# Patient Record
Sex: Male | Born: 1960
Health system: Southern US, Community
[De-identification: ages and names within clinical notes are randomized; demographics above are authoritative.]

## PROBLEM LIST (undated history)

## (undated) DIAGNOSIS — N200 Calculus of kidney: Secondary | ICD-10-CM

---

## 2001-06-06 ENCOUNTER — Emergency Department (HOSPITAL_COMMUNITY): Admission: EM | Admit: 2001-06-06 | Discharge: 2001-06-06 | Payer: Self-pay | Admitting: Emergency Medicine

## 2001-06-06 ENCOUNTER — Encounter: Payer: Self-pay | Admitting: Emergency Medicine

## 2001-06-18 ENCOUNTER — Encounter: Admission: RE | Admit: 2001-06-18 | Discharge: 2001-06-18 | Payer: Self-pay | Admitting: Urology

## 2001-06-18 ENCOUNTER — Encounter: Payer: Self-pay | Admitting: Urology

## 2001-07-02 ENCOUNTER — Emergency Department (HOSPITAL_COMMUNITY): Admission: EM | Admit: 2001-07-02 | Discharge: 2001-07-02 | Payer: Self-pay | Admitting: Emergency Medicine

## 2001-07-02 ENCOUNTER — Encounter: Payer: Self-pay | Admitting: Emergency Medicine

## 2001-07-06 ENCOUNTER — Ambulatory Visit (HOSPITAL_COMMUNITY): Admission: RE | Admit: 2001-07-06 | Discharge: 2001-07-06 | Payer: Self-pay | Admitting: Urology

## 2007-09-06 ENCOUNTER — Encounter: Admission: RE | Admit: 2007-09-06 | Discharge: 2007-09-06 | Payer: Self-pay | Admitting: Family Medicine

## 2010-11-05 NOTE — Op Note (Signed)
[facility]  Patient:    Louis Kennedy, Louis Kennedy Visit Number: [id number] MRN: [id number]          Service Type: DSU Location: DAY Attending Physician:  Londell Moh Dictated by:   Jamison Neighbor, M.D. Proc. Date: 07/06/01 Admit Date:  07/06/2001 Discharge Date: 07/06/2001                             Operative Report  SERVICE:  Urology.  PREOPERATIVE DIAGNOSIS:  Left UVJ stone.  POSTOPERATIVE DIAGNOSIS:  Left UVJ stone.  PROCEDURE:  Cystoscopy, left retrograde, left ureteroscopy and left double J catheter insertion.  SURGEON:  Jamison Neighbor, M.D.  ANESTHESIA:  General.  COMPLICATIONS:  None.  DRAINS:  A 6 French x 26 cm double J catheter.  BRIEF HISTORY:  This patient presented to the emergency room because of what he thought was a kidney stone. CT scan showed a 3 mm stone in the proximal ureter. The patient had a follow-up KUB which showed a 3 mm stone had dropped down to the SI joint. He tried to pass the stone and subsequently it appeared to get stuck down at the bottom of the ureter. The patient was told that he had a very good chance of passing the stone but his symptoms had worsened to the point where he decided he wanted to have the stone removed. The patient understands the risks and benefits of the procedure and gave full and informed consent.  DESCRIPTION OF PROCEDURE:  After successful induction of general anesthesia, the patient was placed in the dorsal lithotomy position and prepped with Betadine and draped in the usual sterile fashion. He was quarry just before anesthesia to determine if he had passed the stone. The patient stated that he had not yet passed it and there was still residual pain on the left hand side. Cystoscopy was performed, the bladder was carefully inspected and no tumors or stones could be seen. The right ureteral orifice was normal in configuration and location. The left ureteral orifice was edematous  and swollen consistent with the recent passage of the stone in the ureter. Attempts at cannulating the ureter were difficult because it did appear that there might be an obstructing lesion in the intramural ureter. The guidewire would not pass due to angulation. The ureteroscope was inserted and was passed into the normal lumen and passed all the way up to the kidney. No stones could be seen. The ureteroscope was withdrawn and the area was carefully inspected. It had dilated up nicely because of the ureter and while there was some swelling there, no calcification in the ureteral lumen could be seen. It is unclear whether the patient may have passed the stone or if perhaps the calcifications previously may be intramural and the calcification have eroded their way through. In either case, nothing is in the course of the ureter that requires removal. A guidewire was passed all the way up to the kidney and a ureteral catheter was passed, contrast was placed within the kidney and this confirmed good placement and demonstrated there were no filling defects in the kidney and as previously noted the upper ureter had been cleared ureteroscopically. The guidewire was left in place, ureteral catheter was removed. Using the cystoscope for guidance, a double J catheter was passed up to the kidney where it coiled normally. It also coiled normally in the bladder. The bladder was drained. A lidocaine jelly was instilled  into the urethra. A B&O suppository was inserted, the patient was given Toradol and Zofran. The patient tolerated the procedure well and was taken to the recovery room in good condition. Dictated by:   Jamison Neighbor, M.D. Attending Physician:  Londell Moh DD:  07/06/01 TD:  07/08/01 Job: [id number]

## 2015-10-16 ENCOUNTER — Other Ambulatory Visit: Payer: Self-pay | Admitting: Family Medicine

## 2015-10-16 DIAGNOSIS — R4702 Dysphasia: Secondary | ICD-10-CM

## 2015-10-23 ENCOUNTER — Ambulatory Visit
Admission: RE | Admit: 2015-10-23 | Discharge: 2015-10-23 | Disposition: A | Discharge status: Home / Self Care | Payer: Managed Care, Other (non HMO) | Source: Ambulatory Visit | Attending: Family Medicine | Admitting: Family Medicine

## 2015-10-23 DIAGNOSIS — R4702 Dysphasia: Secondary | ICD-10-CM

## 2016-06-08 ENCOUNTER — Ambulatory Visit
Admission: RE | Admit: 2016-06-08 | Discharge: 2016-06-08 | Disposition: A | Discharge status: Home / Self Care | Payer: Managed Care, Other (non HMO) | Source: Ambulatory Visit | Attending: Family Medicine | Admitting: Family Medicine

## 2016-06-08 ENCOUNTER — Other Ambulatory Visit: Payer: Managed Care, Other (non HMO)

## 2016-06-08 ENCOUNTER — Other Ambulatory Visit: Payer: Self-pay | Admitting: Family Medicine

## 2016-06-08 DIAGNOSIS — M25422 Effusion, left elbow: Secondary | ICD-10-CM

## 2016-06-08 DIAGNOSIS — R141 Gas pain: Secondary | ICD-10-CM

## 2018-07-16 DIAGNOSIS — N401 Enlarged prostate with lower urinary tract symptoms: Secondary | ICD-10-CM | POA: Diagnosis not present

## 2018-07-16 DIAGNOSIS — R3912 Poor urinary stream: Secondary | ICD-10-CM | POA: Diagnosis not present

## 2018-07-23 DIAGNOSIS — R972 Elevated prostate specific antigen [PSA]: Secondary | ICD-10-CM | POA: Diagnosis not present

## 2018-07-23 DIAGNOSIS — N529 Male erectile dysfunction, unspecified: Secondary | ICD-10-CM | POA: Diagnosis not present

## 2019-02-18 DIAGNOSIS — I10 Essential (primary) hypertension: Secondary | ICD-10-CM | POA: Diagnosis not present

## 2019-02-18 DIAGNOSIS — Z0000 Encounter for general adult medical examination without abnormal findings: Secondary | ICD-10-CM | POA: Diagnosis not present

## 2019-02-18 DIAGNOSIS — E7800 Pure hypercholesterolemia, unspecified: Secondary | ICD-10-CM | POA: Diagnosis not present

## 2019-02-18 DIAGNOSIS — Z23 Encounter for immunization: Secondary | ICD-10-CM | POA: Diagnosis not present

## 2019-04-27 DIAGNOSIS — Z20828 Contact with and (suspected) exposure to other viral communicable diseases: Secondary | ICD-10-CM | POA: Diagnosis not present

## 2019-06-22 ENCOUNTER — Other Ambulatory Visit: Payer: Self-pay

## 2019-06-22 ENCOUNTER — Emergency Department (HOSPITAL_COMMUNITY): Payer: BC Managed Care – PPO

## 2019-06-22 ENCOUNTER — Emergency Department (HOSPITAL_COMMUNITY)
Admission: EM | Admit: 2019-06-22 | Discharge: 2019-06-22 | Disposition: A | Discharge status: Home / Self Care | Payer: BC Managed Care – PPO | Attending: Emergency Medicine | Admitting: Emergency Medicine

## 2019-06-22 ENCOUNTER — Encounter (HOSPITAL_COMMUNITY): Payer: Self-pay | Admitting: Emergency Medicine

## 2019-06-22 DIAGNOSIS — U071 COVID-19: Secondary | ICD-10-CM | POA: Diagnosis not present

## 2019-06-22 DIAGNOSIS — R918 Other nonspecific abnormal finding of lung field: Secondary | ICD-10-CM | POA: Diagnosis not present

## 2019-06-22 DIAGNOSIS — R000 Tachycardia, unspecified: Secondary | ICD-10-CM | POA: Diagnosis not present

## 2019-06-22 DIAGNOSIS — R509 Fever, unspecified: Secondary | ICD-10-CM | POA: Insufficient documentation

## 2019-06-22 DIAGNOSIS — Z79899 Other long term (current) drug therapy: Secondary | ICD-10-CM | POA: Insufficient documentation

## 2019-06-22 DIAGNOSIS — R05 Cough: Secondary | ICD-10-CM | POA: Diagnosis not present

## 2019-06-22 DIAGNOSIS — R0602 Shortness of breath: Secondary | ICD-10-CM

## 2019-06-22 HISTORY — DX: Calculus of kidney: N20.0

## 2019-06-22 LAB — CBC WITH DIFFERENTIAL/PLATELET
Abs Immature Granulocytes: 0.03 10*3/uL (ref 0.00–0.07)
Basophils Absolute: 0 10*3/uL (ref 0.0–0.1)
Basophils Relative: 0 %
Eosinophils Absolute: 0 10*3/uL (ref 0.0–0.5)
Eosinophils Relative: 0 %
HCT: 46.3 % (ref 39.0–52.0)
Hemoglobin: 14.8 g/dL (ref 13.0–17.0)
Immature Granulocytes: 0 %
Lymphocytes Relative: 6 %
Lymphs Abs: 0.7 10*3/uL (ref 0.7–4.0)
MCH: 27.2 pg (ref 26.0–34.0)
MCHC: 32 g/dL (ref 30.0–36.0)
MCV: 85.1 fL (ref 80.0–100.0)
Monocytes Absolute: 0.6 10*3/uL (ref 0.1–1.0)
Monocytes Relative: 6 %
Neutro Abs: 9.6 10*3/uL — ABNORMAL HIGH (ref 1.7–7.7)
Neutrophils Relative %: 88 %
Platelets: 146 10*3/uL — ABNORMAL LOW (ref 150–400)
RBC: 5.44 MIL/uL (ref 4.22–5.81)
RDW: 12.4 % (ref 11.5–15.5)
WBC: 10.9 10*3/uL — ABNORMAL HIGH (ref 4.0–10.5)
nRBC: 0 % (ref 0.0–0.2)

## 2019-06-22 LAB — COMPREHENSIVE METABOLIC PANEL
ALT: 72 U/L — ABNORMAL HIGH (ref 0–44)
AST: 44 U/L — ABNORMAL HIGH (ref 15–41)
Albumin: 3.6 g/dL (ref 3.5–5.0)
Alkaline Phosphatase: 63 U/L (ref 38–126)
Anion gap: 10 (ref 5–15)
BUN: 11 mg/dL (ref 6–20)
CO2: 27 mmol/L (ref 22–32)
Calcium: 9.7 mg/dL (ref 8.9–10.3)
Chloride: 98 mmol/L (ref 98–111)
Creatinine, Ser: 0.97 mg/dL (ref 0.61–1.24)
GFR calc Af Amer: >60 mL/min (ref >60)
GFR calc non Af Amer: >60 mL/min (ref >60)
Glucose, Bld: 109 mg/dL — ABNORMAL HIGH (ref 70–99)
Potassium: 3.5 mmol/L (ref 3.5–5.1)
Sodium: 135 mmol/L (ref 135–145)
Total Bilirubin: 0.9 mg/dL (ref 0.3–1.2)
Total Protein: 6.8 g/dL (ref 6.5–8.1)

## 2019-06-22 LAB — FERRITIN: Ferritin: 324 ng/mL (ref 24–336)

## 2019-06-22 LAB — TRIGLYCERIDES: Triglycerides: 46 mg/dL (ref <150)

## 2019-06-22 LAB — D-DIMER, QUANTITATIVE: D-Dimer, Quant: 0.7 ug/mL-FEU — ABNORMAL HIGH (ref 0.00–0.50)

## 2019-06-22 LAB — C-REACTIVE PROTEIN: CRP: 14.3 mg/dL — ABNORMAL HIGH (ref <1.0)

## 2019-06-22 LAB — PROCALCITONIN: Procalcitonin: <0.1 ng/mL

## 2019-06-22 LAB — LACTATE DEHYDROGENASE: LDH: 203 U/L — ABNORMAL HIGH (ref 98–192)

## 2019-06-22 LAB — LACTIC ACID, PLASMA
Lactic Acid, Venous: 1 mmol/L (ref 0.5–1.9)
Lactic Acid, Venous: 1.6 mmol/L (ref 0.5–1.9)

## 2019-06-22 LAB — FIBRINOGEN: Fibrinogen: 758 mg/dL — ABNORMAL HIGH (ref 210–475)

## 2019-06-22 MED ORDER — SODIUM CHLORIDE (PF) 0.9 % IJ SOLN
INTRAMUSCULAR | Status: AC
  Filled 2019-06-22: qty 50

## 2019-06-22 MED ORDER — ACETAMINOPHEN 500 MG PO TABS
1000.0000 mg | ORAL_TABLET | Freq: Once | ORAL | Status: AC
  Administered 2019-06-22: 1000 mg via ORAL
  Filled 2019-06-22: qty 2

## 2019-06-22 MED ORDER — IOHEXOL 350 MG/ML SOLN
100.0000 mL | Freq: Once | INTRAVENOUS | Status: AC | PRN
  Administered 2019-06-22: 100 mL via INTRAVENOUS

## 2019-06-22 NOTE — ED Provider Notes (Signed)
Blood pressure (!) 159/89, pulse (!) 129, temperature 99.9 F (37.7 C), temperature source Oral, resp. rate (!) 30, height 5\' 7"  (1.702 m), weight 67.1 kg, SpO2 96 %.  Assuming care from Dr. .  In short, Louis Kennedy is a 59 y.o. male with a chief complaint of COVID + .  Refer to the original H&P for additional details.  The current plan of care is to f/u on labs and reassess.  08:50 AM  Patient reevaluated.  He is breathing comfortably on room air but does develop tachypnea with movement.  He has persistent tachycardia to the 120s.  Plan for CTA to evaluate for PE and will ambulate on pulse ox. Labs reviewed and consistent with COVID 19 infection.   10:14 AM  Reevaluated the patient.  He remains comfortable appearing.  He ambulated around the room with no reported dyspnea.  Lowest oxygen saturation of 93%.  The patient CT scan of the chest did not show PE and did show multifocal pneumonia consistent with COVID-19 diagnosis.  No pericardial effusion.  Patient does remain tachycardic here but is relatively asymptomatic and not requiring supplemental oxygen.  I had him put on his home pulse ox which is consistently reading lower than ours by approximately 6 percentage points.  We will continue to follow symptoms at home.  He feels comfortable with plan of discharge with strict return precautions discussed.    61, MD 06/22/19 1015

## 2019-06-22 NOTE — ED Notes (Signed)
SPO2 while resting: 96%. SPO2 while ambulating: 93%. He is in no distress and is undergoing chest CT as I write this.

## 2019-06-22 NOTE — ED Triage Notes (Signed)
Pt reports having confirmed COVID and pneumonia. Pt currently taking Azithromycin and Motrin that was recently prescribed. Pt states it feels like heart is racing.

## 2019-06-22 NOTE — ED Provider Notes (Signed)
[facility] [facility] Provider Note: Georgena Spurling, MD, FACEP  CSN: [id number] MRN: [id number] ARRIVAL: 06/22/19 at [place]: Tat Momoli  06/22/19 5:50 AM Louis Kennedy is a 59 y.o. male with COVID-19.  His symptoms began 8 days ago.  He has been having cough, shortness of breath, body aches, fever, rapid heart rate when fever is up, and general malaise.  He was placed on azithromycin earlier in the week for presumed pneumonia.  He has been taking ibuprofen for fever.  His last dose was about 10:30 PM.  He is now feeling more short of breath, worse with exertion.  He denies frank pain at the present time.   Past Medical History:  Diagnosis Date  . Kidney stone     History reviewed. No pertinent surgical history.  History reviewed. No pertinent family history.  Social History   Tobacco Use  . Smoking status: Never Smoker  . Smokeless tobacco: Never Used  Substance Use Topics  . Alcohol use: Never  . Drug use: Never    Prior to Admission medications   Medication Sig Start Date End Date Taking? Authorizing Provider  amLODipine (NORVASC) 5 MG tablet Take 5 mg by mouth daily.   Yes [provider]  atorvastatin (LIPITOR) 10 MG tablet Take 10 mg by mouth daily.   Yes [provider]    Allergies Penicillins   REVIEW OF SYSTEMS  Negative except as noted here or in the History of Present Illness.   PHYSICAL EXAMINATION  Initial Vital Signs Blood pressure (!) 147/78, pulse (!) 107, temperature 99.9 F (37.7 C), temperature source Oral, resp. rate 19, height 5\' 7"  (1.702 m), weight 67.1 kg, SpO2 96 %.  Examination General: Well-developed, well-nourished male in no acute distress; appearance consistent with age of record HENT: normocephalic; atraumatic Eyes: Normal appearance Neck: supple Heart: regular rate and rhythm; tachycardia Lungs: Mildly decreased air movement bilaterally Abdomen:  soft; nondistended; nontender; bowel sounds present Extremities: No deformity; full range of motion Neurologic: Awake, alert and oriented; motor function intact in all extremities and symmetric; no facial droop; shivering Skin: Warm and dry Psychiatric: Normal mood and affect   RESULTS  Summary of this visit's results, reviewed and interpreted by myself:   EKG Interpretation  Date/Time:  Saturday June 22 2019 06:51:32 EST Ventricular Rate:  129 PR Interval:    QRS Duration: 79 QT Interval:  282 QTC Calculation: 413 R Axis:   -50 Text Interpretation: Sinus tachycardia Left anterior fascicular block Abnormal R-wave progression, early transition No previous ECGs available Confirmed by Shanon Rosser [phone]) on 06/22/2019 7:03:47 AM      Laboratory Studies: Results for orders placed or performed during the hospital encounter of 06/22/19 (from the past 24 hour(s))  Lactic acid, plasma     Status: None   Collection Time: 06/22/19  6:51 AM  Result Value Ref Range   Lactic Acid, Venous 1.0 0.5 - 1.9 mmol/L  Lactic acid, plasma     Status: None   Collection Time: 06/22/19  6:51 AM  Result Value Ref Range   Lactic Acid, Venous 1.6 0.5 - 1.9 mmol/L  CBC WITH DIFFERENTIAL     Status: Abnormal   Collection Time: 06/22/19  6:51 AM  Result Value Ref Range   WBC 10.9 (H) 4.0 - 10.5 K/uL   RBC 5.44 4.22 - 5.81 MIL/uL   Hemoglobin 14.8 13.0 - 17.0 g/dL   HCT 46.3  39.0 - 52.0 %   MCV 85.1 80.0 - 100.0 fL   MCH 27.2 26.0 - 34.0 pg   MCHC 32.0 30.0 - 36.0 g/dL   RDW 38.2 50.5 - 39.7 %   Platelets 146 (L) 150 - 400 K/uL   nRBC 0.0 0.0 - 0.2 %   Neutrophils Relative % 88 %   Neutro Abs 9.6 (H) 1.7 - 7.7 K/uL   Lymphocytes Relative 6 %   Lymphs Abs 0.7 0.7 - 4.0 K/uL   Monocytes Relative 6 %   Monocytes Absolute 0.6 0.1 - 1.0 K/uL   Eosinophils Relative 0 %   Eosinophils Absolute 0.0 0.0 - 0.5 K/uL   Basophils Relative 0 %   Basophils Absolute 0.0 0.0 - 0.1 K/uL   Immature Granulocytes  0 %   Abs Immature Granulocytes 0.03 0.00 - 0.07 K/uL  Comprehensive metabolic panel     Status: Abnormal   Collection Time: 06/22/19  6:51 AM  Result Value Ref Range   Sodium 135 135 - 145 mmol/L   Potassium 3.5 3.5 - 5.1 mmol/L   Chloride 98 98 - 111 mmol/L   CO2 27 22 - 32 mmol/L   Glucose, Bld 109 (H) 70 - 99 mg/dL   BUN 11 6 - 20 mg/dL   Creatinine, Ser 6.73 0.61 - 1.24 mg/dL   Calcium 9.7 8.9 - 41.9 mg/dL   Total Protein 6.8 6.5 - 8.1 g/dL   Albumin 3.6 3.5 - 5.0 g/dL   AST 44 (H) 15 - 41 U/L   ALT 72 (H) 0 - 44 U/L   Alkaline Phosphatase 63 38 - 126 U/L   Total Bilirubin 0.9 0.3 - 1.2 mg/dL   GFR calc non Af Amer >60 >60 mL/min   GFR calc Af Amer >60 >60 mL/min   Anion gap 10 5 - 15  D-dimer, quantitative     Status: Abnormal   Collection Time: 06/22/19  6:51 AM  Result Value Ref Range   D-Dimer, Quant 0.70 (H) 0.00 - 0.50 ug/mL-FEU  Procalcitonin     Status: None   Collection Time: 06/22/19  6:51 AM  Result Value Ref Range   Procalcitonin <0.10 ng/mL  Lactate dehydrogenase     Status: Abnormal   Collection Time: 06/22/19  6:51 AM  Result Value Ref Range   LDH 203 (H) 98 - 192 U/L  Ferritin     Status: None   Collection Time: 06/22/19  6:51 AM  Result Value Ref Range   Ferritin 324 24 - 336 ng/mL  Triglycerides     Status: None   Collection Time: 06/22/19  6:51 AM  Result Value Ref Range   Triglycerides 46 <150 mg/dL  Fibrinogen     Status: Abnormal   Collection Time: 06/22/19  6:51 AM  Result Value Ref Range   Fibrinogen 758 (H) 210 - 475 mg/dL  C-reactive protein     Status: Abnormal   Collection Time: 06/22/19  6:51 AM  Result Value Ref Range   CRP 14.3 (H) <1.0 mg/dL   Imaging Studies: CT Angio Chest PE W and/or Wo Contrast  Result Date: 06/22/2019 CLINICAL DATA:  Shortness of breath, elevated D-dimer level. EXAM: CT ANGIOGRAPHY CHEST WITH CONTRAST TECHNIQUE: Multidetector CT imaging of the chest was performed using the standard protocol during bolus  administration of intravenous contrast. Multiplanar CT image reconstructions and MIPs were obtained to evaluate the vascular anatomy. CONTRAST:  OMNIPAQUE IOHEXOL 350 MG/ML SOLN COMPARISON:  None. FINDINGS: Cardiovascular: Satisfactory opacification  of the pulmonary arteries to the segmental level. No evidence of pulmonary embolism. Normal heart size. No pericardial effusion. Mediastinum/Nodes: No enlarged mediastinal, hilar, or axillary lymph nodes. Thyroid gland, trachea, and esophagus demonstrate no significant findings. Lungs/Pleura: No pneumothorax or pleural effusion is noted. Multiple ground-glass opacities are noted throughout both lungs consistent with multifocal pneumonia, potentially of viral etiology. Upper Abdomen: No acute abnormality. Musculoskeletal: No chest wall abnormality. No acute or significant osseous findings. Review of the MIP images confirms the above findings. IMPRESSION: 1. No definite evidence of pulmonary embolus. 2. Multiple ground-glass opacities are noted throughout both lungs consistent with multifocal pneumonia, potentially of viral etiology. Electronically Signed   By: Lupita Raider M.D.   On: 06/22/2019 09:55   DG Chest Port 1 View  Result Date: 06/22/2019 CLINICAL DATA:  COVID-19 positive. EXAM: PORTABLE CHEST 1 VIEW COMPARISON:  None. FINDINGS: Confluent airspace disease at the right lung base, with additional vague patchy opacities in the right mid lung and left lower lung zone. The heart is normal in size. No pulmonary edema, pleural effusion, or pneumothorax. No acute osseous abnormalities are seen. IMPRESSION: Vague patchy opacities in both lungs, pattern consistent with COVID-19 pneumonia. More confluent right lower lobe opacity may represent COVID or superimposed bacterial infection. Electronically Signed   By: Narda Rutherford M.D.   On: 06/22/2019 06:20    ED COURSE and MDM  Nursing notes, initial and subsequent vitals signs, including pulse oximetry,  reviewed and interpreted by myself.  Vitals:   06/22/19 0924 06/22/19 0925 06/22/19 0930 06/22/19 1015  BP: 136/68  (!) 151/84   Pulse: (!) 121  (!) 119 (!) 118  Resp: (!) 24  (!) 29 (!) 29  Temp:  99.9 F (37.7 C)    TempSrc:  Oral    SpO2: 95%  95% 96%  Weight:      Height:       Medications  acetaminophen (TYLENOL) tablet 1,000 mg (1,000 mg Oral Given 06/22/19 0640)  iohexol (OMNIPAQUE) 350 MG/ML injection 100 mL (100 mLs Intravenous Contrast Given 06/22/19 0945)   7:00 AM Signed out to Dr. Jacqulyn Bath.   PROCEDURES  Procedures   ED DIAGNOSES     ICD-10-CM   1. COVID-19  U07.1   2. SOB (shortness of breath) on exertion  R06.02   3. Tachycardia  R00.0        Lorrin Nawrot, Jonny Ruiz, MD 06/22/19 2241

## 2019-06-22 NOTE — Discharge Instructions (Signed)
You were seen in the emergency department today with shortness of breath consistent with your known COVID-19 diagnosis.  You should continue to monitor your symptoms at home very carefully.  If your pulse ox at home is reading consistently in the mid to low 80s you should return.  If your pulse ox remains in the normal range but you develop chest pain, worsening shortness of breath, severe fatigue, or other sudden worsening symptoms he should also return to the emergency department.

## 2019-06-24 ENCOUNTER — Other Ambulatory Visit: Payer: Self-pay

## 2019-06-24 ENCOUNTER — Emergency Department (HOSPITAL_COMMUNITY): Payer: BC Managed Care – PPO

## 2019-06-24 ENCOUNTER — Encounter (HOSPITAL_COMMUNITY): Payer: Self-pay | Admitting: Emergency Medicine

## 2019-06-24 ENCOUNTER — Inpatient Hospital Stay (HOSPITAL_COMMUNITY)
Admission: EM | Admit: 2019-06-24 | Discharge: 2019-06-27 | DRG: 871 | Disposition: A | Discharge status: Home / Self Care | Payer: BC Managed Care – PPO | Attending: Internal Medicine | Admitting: Internal Medicine

## 2019-06-24 DIAGNOSIS — R0602 Shortness of breath: Secondary | ICD-10-CM | POA: Diagnosis not present

## 2019-06-24 DIAGNOSIS — E785 Hyperlipidemia, unspecified: Secondary | ICD-10-CM | POA: Diagnosis not present

## 2019-06-24 DIAGNOSIS — A4189 Other specified sepsis: Principal | ICD-10-CM | POA: Diagnosis present

## 2019-06-24 DIAGNOSIS — J1282 Pneumonia due to coronavirus disease 2019: Secondary | ICD-10-CM | POA: Diagnosis not present

## 2019-06-24 DIAGNOSIS — J9601 Acute respiratory failure with hypoxia: Secondary | ICD-10-CM | POA: Diagnosis present

## 2019-06-24 DIAGNOSIS — J069 Acute upper respiratory infection, unspecified: Secondary | ICD-10-CM | POA: Diagnosis not present

## 2019-06-24 DIAGNOSIS — Z79899 Other long term (current) drug therapy: Secondary | ICD-10-CM | POA: Diagnosis not present

## 2019-06-24 DIAGNOSIS — Z880 Allergy status to penicillin: Secondary | ICD-10-CM

## 2019-06-24 DIAGNOSIS — I10 Essential (primary) hypertension: Secondary | ICD-10-CM | POA: Diagnosis present

## 2019-06-24 DIAGNOSIS — U071 COVID-19: Secondary | ICD-10-CM | POA: Diagnosis not present

## 2019-06-24 LAB — TRIGLYCERIDES: Triglycerides: 46 mg/dL

## 2019-06-24 LAB — CBC WITH DIFFERENTIAL/PLATELET
Abs Immature Granulocytes: 0.18 10*3/uL — ABNORMAL HIGH (ref 0.00–0.07)
Basophils Absolute: 0 10*3/uL (ref 0.0–0.1)
Basophils Relative: 0 %
Eosinophils Absolute: 0 10*3/uL (ref 0.0–0.5)
Eosinophils Relative: 0 %
HCT: 43.4 % (ref 39.0–52.0)
Hemoglobin: 13.9 g/dL (ref 13.0–17.0)
Immature Granulocytes: 1 %
Lymphocytes Relative: 4 %
Lymphs Abs: 0.7 10*3/uL (ref 0.7–4.0)
MCH: 26.9 pg (ref 26.0–34.0)
MCHC: 32 g/dL (ref 30.0–36.0)
MCV: 84.1 fL (ref 80.0–100.0)
Monocytes Absolute: 1 10*3/uL (ref 0.1–1.0)
Monocytes Relative: 6 %
Neutro Abs: 15 10*3/uL — ABNORMAL HIGH (ref 1.7–7.7)
Neutrophils Relative %: 89 %
Platelets: 204 10*3/uL (ref 150–400)
RBC: 5.16 MIL/uL (ref 4.22–5.81)
RDW: 12.6 % (ref 11.5–15.5)
WBC: 16.9 10*3/uL — ABNORMAL HIGH (ref 4.0–10.5)
nRBC: 0 % (ref 0.0–0.2)

## 2019-06-24 LAB — HEPATIC FUNCTION PANEL
ALT: 191 U/L — ABNORMAL HIGH (ref 0–44)
AST: 116 U/L — ABNORMAL HIGH (ref 15–41)
Albumin: 3 g/dL — ABNORMAL LOW (ref 3.5–5.0)
Alkaline Phosphatase: 92 U/L (ref 38–126)
Bilirubin, Direct: 0.4 mg/dL — ABNORMAL HIGH (ref 0.0–0.2)
Indirect Bilirubin: 0.5 mg/dL (ref 0.3–0.9)
Total Bilirubin: 0.9 mg/dL (ref 0.3–1.2)
Total Protein: 6.7 g/dL (ref 6.5–8.1)

## 2019-06-24 LAB — BASIC METABOLIC PANEL
Anion gap: 9 (ref 5–15)
BUN: 9 mg/dL (ref 6–20)
CO2: 27 mmol/L (ref 22–32)
Calcium: 10 mg/dL (ref 8.9–10.3)
Chloride: 99 mmol/L (ref 98–111)
Creatinine, Ser: 1.02 mg/dL (ref 0.61–1.24)
GFR calc Af Amer: >60 mL/min (ref >60)
GFR calc non Af Amer: >60 mL/min (ref >60)
Glucose, Bld: 151 mg/dL — ABNORMAL HIGH (ref 70–99)
Potassium: 3.9 mmol/L (ref 3.5–5.1)
Sodium: 135 mmol/L (ref 135–145)

## 2019-06-24 LAB — D-DIMER, QUANTITATIVE: D-Dimer, Quant: 0.83 ug/mL-FEU — ABNORMAL HIGH (ref 0.00–0.50)

## 2019-06-24 LAB — CREATININE, SERUM
Creatinine, Ser: 0.91 mg/dL (ref 0.61–1.24)
GFR calc Af Amer: >60 mL/min (ref >60)
GFR calc non Af Amer: >60 mL/min (ref >60)

## 2019-06-24 LAB — HIV ANTIBODY (ROUTINE TESTING W REFLEX): HIV Screen 4th Generation wRfx: NONREACTIVE

## 2019-06-24 LAB — ABO/RH: ABO/RH(D): O POS

## 2019-06-24 LAB — C-REACTIVE PROTEIN: CRP: 22.9 mg/dL — ABNORMAL HIGH

## 2019-06-24 LAB — LACTATE DEHYDROGENASE: LDH: 306 U/L — ABNORMAL HIGH (ref 98–192)

## 2019-06-24 LAB — LACTIC ACID, PLASMA: Lactic Acid, Venous: 1.2 mmol/L (ref 0.5–1.9)

## 2019-06-24 LAB — FIBRINOGEN: Fibrinogen: >800 mg/dL — ABNORMAL HIGH (ref 210–475)

## 2019-06-24 LAB — FERRITIN: Ferritin: 908 ng/mL — ABNORMAL HIGH (ref 24–336)

## 2019-06-24 LAB — PROCALCITONIN: Procalcitonin: 0.25 ng/mL

## 2019-06-24 LAB — SARS CORONAVIRUS 2 (TAT 6-24 HRS): SARS Coronavirus 2: POSITIVE — AB

## 2019-06-24 LAB — BRAIN NATRIURETIC PEPTIDE: B Natriuretic Peptide: 42.8 pg/mL (ref 0.0–100.0)

## 2019-06-24 MED ORDER — SODIUM CHLORIDE 0.9% FLUSH: 3.0000 mL | INTRAVENOUS | Status: DC | PRN

## 2019-06-24 MED ORDER — TOCILIZUMAB 400 MG/20ML IV SOLN: 8.0000 mg/kg | Freq: Once | INTRAVENOUS | Status: DC

## 2019-06-24 MED ORDER — SENNOSIDES-DOCUSATE SODIUM 8.6-50 MG PO TABS: 1.0000 | ORAL_TABLET | Freq: Every evening | ORAL | Status: DC | PRN

## 2019-06-24 MED ORDER — ALBUTEROL SULFATE HFA 108 (90 BASE) MCG/ACT IN AERS
2.0000 | INHALATION_SPRAY | Freq: Four times a day (QID) | RESPIRATORY_TRACT | Status: DC
  Administered 2019-06-24 – 2019-06-27 (×14): 2 via RESPIRATORY_TRACT
  Filled 2019-06-24: qty 6.7

## 2019-06-24 MED ORDER — SODIUM CHLORIDE 0.9 % IV SOLN: Freq: Once | INTRAVENOUS | Status: AC

## 2019-06-24 MED ORDER — SODIUM CHLORIDE 0.9 % IV SOLN: 250.0000 mL | INTRAVENOUS | Status: DC | PRN

## 2019-06-24 MED ORDER — DEXAMETHASONE SODIUM PHOSPHATE 10 MG/ML IJ SOLN
6.0000 mg | INTRAMUSCULAR | Status: DC
  Administered 2019-06-24 – 2019-06-25 (×2): 6 mg via INTRAVENOUS
  Filled 2019-06-24 (×2): qty 1

## 2019-06-24 MED ORDER — ATORVASTATIN CALCIUM 10 MG PO TABS
10.0000 mg | ORAL_TABLET | Freq: Every day | ORAL | Status: DC
  Administered 2019-06-24 – 2019-06-25 (×2): 10 mg via ORAL
  Filled 2019-06-24 (×2): qty 1

## 2019-06-24 MED ORDER — ENOXAPARIN SODIUM 40 MG/0.4ML ~~LOC~~ SOLN
40.0000 mg | SUBCUTANEOUS | Status: DC
  Administered 2019-06-24 – 2019-06-27 (×4): 40 mg via SUBCUTANEOUS
  Filled 2019-06-24 (×4): qty 0.4

## 2019-06-24 MED ORDER — ASCORBIC ACID 500 MG PO TABS
500.0000 mg | ORAL_TABLET | Freq: Every day | ORAL | Status: DC
  Administered 2019-06-24 – 2019-06-27 (×4): 500 mg via ORAL
  Filled 2019-06-24 (×4): qty 1

## 2019-06-24 MED ORDER — ACETAMINOPHEN 325 MG PO TABS
650.0000 mg | ORAL_TABLET | Freq: Four times a day (QID) | ORAL | Status: DC | PRN
  Administered 2019-06-24: 650 mg via ORAL
  Filled 2019-06-24: qty 2

## 2019-06-24 MED ORDER — SODIUM CHLORIDE 0.9% FLUSH
3.0000 mL | Freq: Two times a day (BID) | INTRAVENOUS | Status: DC
  Administered 2019-06-24 – 2019-06-25 (×4): 3 mL via INTRAVENOUS
  Administered 2019-06-26: 10 mL via INTRAVENOUS
  Administered 2019-06-26 – 2019-06-27 (×2): 3 mL via INTRAVENOUS

## 2019-06-24 MED ORDER — FUROSEMIDE 10 MG/ML IJ SOLN
20.0000 mg | Freq: Once | INTRAMUSCULAR | Status: AC
  Administered 2019-06-24: 20 mg via INTRAVENOUS
  Filled 2019-06-24: qty 4

## 2019-06-24 MED ORDER — SODIUM CHLORIDE 0.9 % IV SOLN: 100.0000 mg | Freq: Every day | INTRAVENOUS | Status: DC

## 2019-06-24 MED ORDER — DEXAMETHASONE SODIUM PHOSPHATE 10 MG/ML IJ SOLN
6.0000 mg | INTRAMUSCULAR | Status: DC
  Filled 2019-06-24: qty 1

## 2019-06-24 MED ORDER — GUAIFENESIN-DM 100-10 MG/5ML PO SYRP: 10.0000 mL | ORAL_SOLUTION | ORAL | Status: DC | PRN

## 2019-06-24 MED ORDER — SODIUM CHLORIDE 0.9 % IV SOLN
200.0000 mg | Freq: Once | INTRAVENOUS | Status: AC
  Administered 2019-06-24: 200 mg via INTRAVENOUS
  Filled 2019-06-24: qty 200

## 2019-06-24 MED ORDER — METOPROLOL TARTRATE 25 MG PO TABS
25.0000 mg | ORAL_TABLET | Freq: Two times a day (BID) | ORAL | Status: DC
  Administered 2019-06-24 – 2019-06-27 (×7): 25 mg via ORAL
  Filled 2019-06-24 (×7): qty 1

## 2019-06-24 MED ORDER — SODIUM CHLORIDE 0.9 % IV SOLN
100.0000 mg | Freq: Every day | INTRAVENOUS | Status: DC
  Administered 2019-06-25 – 2019-06-27 (×3): 100 mg via INTRAVENOUS
  Filled 2019-06-24 (×3): qty 20

## 2019-06-24 MED ORDER — LEVOFLOXACIN 750 MG PO TABS
750.0000 mg | ORAL_TABLET | Freq: Every day | ORAL | Status: DC
  Administered 2019-06-24 – 2019-06-25 (×2): 750 mg via ORAL
  Filled 2019-06-24 (×2): qty 1

## 2019-06-24 MED ORDER — HYDROCODONE-ACETAMINOPHEN 5-325 MG PO TABS: 1.0000 | ORAL_TABLET | ORAL | Status: DC | PRN

## 2019-06-24 MED ORDER — ZINC SULFATE 220 (50 ZN) MG PO CAPS
220.0000 mg | ORAL_CAPSULE | Freq: Every day | ORAL | Status: DC
  Administered 2019-06-24 – 2019-06-27 (×4): 220 mg via ORAL
  Filled 2019-06-24 (×4): qty 1

## 2019-06-24 MED ORDER — SODIUM CHLORIDE 0.9 % IV SOLN
200.0000 mg | Freq: Once | INTRAVENOUS | Status: DC
  Filled 2019-06-24: qty 40

## 2019-06-24 MED ORDER — ACETAMINOPHEN 500 MG PO TABS
1000.0000 mg | ORAL_TABLET | Freq: Once | ORAL | Status: AC
  Administered 2019-06-24: 1000 mg via ORAL
  Filled 2019-06-24: qty 2

## 2019-06-24 MED ORDER — TOCILIZUMAB 400 MG/20ML IV SOLN
8.0000 mg/kg | Freq: Once | INTRAVENOUS | Status: AC
  Administered 2019-06-24: 536 mg via INTRAVENOUS
  Filled 2019-06-24: qty 20

## 2019-06-24 NOTE — H&P (Addendum)
History and Physical        Hospital Admission Note Date: 06/24/2019  Patient name: Louis Kennedy Medical record number: [id number] Date of birth: 09-15-1960 Age: 59 y.o. Gender: male  PCP: Patient, No Pcp Per    Patient coming from: home   I have reviewed all records in the [facility].    Chief Complaint:  Cough, diarrhea, fevers, worsening shortness of breath  HPI: Patient is a 59 year old male with history of hypertension, hyperlipidemia presented to ED with COVID-19.  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  He had taken full precautions however 2 weeks ago he had to go to the ER for muscle spasm and pain in his legs.  He thinks that is how he got the Covid.  10 days ago he started having productive coughing, generalized weakness, nausea and diarrhea.  Diarrhea is currently improving.  6 days ago he had Covid test done which was positive in [place].  He returned back to [facility] on December 31,2020.  Patient reported that his symptoms have been worsening, he initially could walk to his bathroom however now he he feels short of breath with minimal exertion.  He was started on Zithromax outpatient.  Also reported fevers and rigors at home.  Patient has a Covid positive test on 06/18/2019 in care everywhere   ED work-up/course:  Temp 100.7 in ED, respiratory rate 35, BP 138/86, heart rate 119 -130. BMET unremarkable, LDH 306, triglycerides 46, ferritin 908 COVID-19 test in epic pending CT angiogram of the chest showed no PE, multiple groundglass opacities noted throughout both lungs consistent with multifocal pneumonia potentially a viral etiology  Review of Systems: Positives marked in 'bold' Constitutional: + fever, chills, diaphoresis, poor appetite and fatigue.  HEENT: Denies photophobia, eye pain,  redness, hearing loss, ear pain, congestion, sore throat, rhinorrhea, sneezing, mouth sores, trouble swallowing, neck pain, neck stiffness and tinnitus.   Respiratory: Please see HPI Cardiovascular: Also feels palpitation and heart racing Gastrointestinal: Denies blood in stool and abdominal distention. + Nausea and diarrhea Genitourinary: Denies dysuria, urgency, frequency, hematuria, flank pain and difficulty urinating.  Musculoskeletal: +myalgias, no back pain, joint swelling, arthralgias and gait problem.  Skin: Denies pallor, rash and wound.  Neurological: Denies numbness and headaches. + Generalized weakness  hematological: Denies adenopathy. Easy bruising, personal or family bleeding history  Psychiatric/Behavioral: Denies suicidal ideation, mood changes, confusion, nervousness, sleep disturbance and agitation  Past Medical History: Past Medical History:  Diagnosis Date  . Kidney stone     History reviewed. No pertinent surgical history.  Medications: Prior to Admission medications   Medication Sig Start Date End Date Taking? Authorizing Provider  amLODipine (NORVASC) 5 MG tablet Take 5 mg by mouth daily.    [provider]  atorvastatin (LIPITOR) 10 MG tablet Take 10 mg by mouth daily.    [provider]    Allergies:   Allergies  Allergen Reactions  . Penicillins Other (See Comments)    Dizziness  Did it involve swelling of the face/tongue/throat, SOB, or low BP? No Did it involve sudden or severe rash/hives, skin peeling,  or any reaction on the inside of your mouth or nose? No  Did you need to seek medical attention at a hospital or doctor's office? No When did it last happen?Several years ago If all above answers are "NO", may proceed with cephalosporin use.     Social History:  reports that he has never smoked. He has never used smokeless tobacco. He reports that he does not drink alcohol or use drugs.  Family History: Patient reports  that his family members also have Covid but his symptoms are worse  Physical Exam: Blood pressure (!) 154/92, pulse (!) 130, temperature (!) 100.7 F (38.2 C), temperature source Oral, resp. rate (!) 26, SpO2 94 %. General: Alert, awake, oriented x3, uncomfortable, no acute respiratory distress Eyes: pink conjunctiva,anicteric sclera, pupils equal and reactive to light and accomodation, HEENT: normocephalic, atraumatic, oropharynx clear Neck: supple, no masses or lymphadenopathy, no goiter, no bruits, no JVD CVS: Regular rate and rhythm, without murmurs, rubs or gallops. No lower extremity edema Resp : Decreased breath sound at the bases GI : Soft, nontender, nondistended, positive bowel sounds, no masses. No hepatomegaly. No hernia.  Musculoskeletal: No clubbing or cyanosis, positive pedal pulses. No contracture. ROM intact  Neuro: Grossly intact, no focal neurological deficits, strength 5/5 upper and lower extremities bilaterally Psych: alert and oriented x 3, normal mood and affect Skin: no rashes or lesions, warm and dry   LABS on Admission: I have personally reviewed all the labs and imagings below    Basic Metabolic Panel: Recent Labs  Lab 06/22/19 0651 06/24/19 0542  NA 135 135  K 3.5 3.9  CL 98 99  CO2 27 27  GLUCOSE 109* 151*  BUN 11 9  CREATININE 0.97 1.02  CALCIUM 9.7 10.0   Liver Function Tests: Recent Labs  Lab 06/22/19 0651  AST 44*  ALT 72*  ALKPHOS 63  BILITOT 0.9  PROT 6.8  ALBUMIN 3.6   No results for input(s): LIPASE, AMYLASE in the last 168 hours. No results for input(s): AMMONIA in the last 168 hours. CBC: Recent Labs  Lab 06/22/19 0651 06/24/19 0542  WBC 10.9* 16.9*  NEUTROABS 9.6* 15.0*  HGB 14.8 13.9  HCT 46.3 43.4  MCV 85.1 84.1  PLT 146* 204   Cardiac Enzymes: No results for input(s): CKTOTAL, CKMB, CKMBINDEX, TROPONINI in the last 168 hours. BNP: Invalid input(s): POCBNP CBG: No results for input(s): GLUCAP in the last 168  hours.  Radiological Exams on Admission:  CT Angio Chest PE W and/or Wo Contrast  Result Date: 06/22/2019 CLINICAL DATA:  Shortness of breath, elevated D-dimer level. EXAM: CT ANGIOGRAPHY CHEST WITH CONTRAST TECHNIQUE: Multidetector CT imaging of the chest was performed using the standard protocol during bolus administration of intravenous contrast. Multiplanar CT image reconstructions and MIPs were obtained to evaluate the vascular anatomy. CONTRAST:  147m OMNIPAQUE IOHEXOL 350 MG/ML SOLN COMPARISON:  None. FINDINGS: Cardiovascular: Satisfactory opacification of the pulmonary arteries to the segmental level. No evidence of pulmonary embolism. Normal heart size. No pericardial effusion. Mediastinum/Nodes: No enlarged mediastinal, hilar, or axillary lymph nodes. Thyroid gland, trachea, and esophagus demonstrate no significant findings. Lungs/Pleura: No pneumothorax or pleural effusion is noted. Multiple ground-glass opacities are noted throughout both lungs consistent with multifocal pneumonia, potentially of viral etiology. Upper Abdomen: No acute abnormality. Musculoskeletal: No chest wall abnormality. No acute or significant osseous findings. Review of the MIP images confirms the above findings. IMPRESSION: 1. No definite evidence of pulmonary embolus. 2. Multiple ground-glass opacities are noted throughout  both lungs consistent with multifocal pneumonia, potentially of viral etiology. Electronically Signed   By: Marijo Conception M.D.   On: 06/22/2019 09:55   DG Chest Port 1 View  Result Date: 06/24/2019 CLINICAL DATA:  Hypoxia. Shortness of breath.  COVID-19 positive. EXAM: PORTABLE CHEST 1 VIEW COMPARISON:  Radiographs and CTA 06/22/2019 FINDINGS: Persistent low lung volumes. Worsening bilateral airspace disease primarily at the left lung base. The heart is normal in size. Normal mediastinal contours. No pneumothorax or large pleural effusion. IMPRESSION: Worsening bilateral airspace disease consistent  with COVID pneumonia. Electronically Signed   By: Keith Rake M.D.   On: 06/24/2019 05:57      EKG: Independently reviewed.  Rate 130, sinus rhythm, tachycardia   Assessment/Plan Principal Problem:  Acute hypoxic respiratory failure due to acute COVID-19 viral pneumonia during the ongoing 2020 COVID-19 pandemic- POA, sepsis - Patient presented with shortness of breath, dyspnea on exertion, fevers, rigors, diarrhea.  CT angiogram chest showed multifocal pneumonia, no PE.  Met sepsis criteria with fevers, tachycardia, tachypnea, multifocal pneumonia. - Currently hypoxic, requiring 3 L O2 via nasal cannula -Started Decadron 6 mg IV daily, Remdesivir per pharmacy protocol  -CRP elevated, 22.9, on 3 L O2, will give 1 dose of Actemra.  Procalcitonin mildly elevated 0.25, patient was on Zithromax prior to admission, will place on Levaquin, has penicillin allergy.  Discussed in detail with the patient, has no history of TB or immunosuppression, LFTs normal.  He agrees to proceed with Actemra - Continue Supportive care: vitamin C/zinc, albuterol, Tylenol. - Continue to wean oxygen, ambulatory O2 screening daily as tolerated  - Oxygen - SpO2: 96 % O2 Flow Rate (L/min): 3 L/min - Continue to follow labs as below  No results found for: SARSCOV2NAA .  Patient has a positive Covid test from 06/18/2019 in care everywhere  Recent Labs  Lab 06/22/19 0651 06/24/19 0542 06/24/19 0543  DDIMER 0.70* 0.83*  --   FERRITIN 324  --  908*  CRP 14.3*  --  22.9*  ALT 72*  --   --   PROCALCITON <0.10 0.25  --     Addendum:  O2 sats worsening, from 4 L to 10 L -Ordered 1 dose of IV Lasix 20 mg, prone, transfer to [facility] when bed available  Essential hypertension with tachycardia - will place on Lopressor 25 mg twice daily  Hyperlipidemia LFTs within normal limits, continue statin   DVT prophylaxis: Lovenox  CODE STATUS: Full CODE STATUS discussed with the patient  Consults called:  None  Family Communication: Admission, patients condition and plan of care including tests being ordered have been discussed with the patient who indicates understanding and agree with the plan and Code Status  Admission status: Inpatient, MedSurg  The medical decision making on this patient was of high complexity and the patient is at high risk for clinical deterioration, therefore this is a level 3 admission.  Severity of Illness:    The appropriate patient status for this patient is INPATIENT. Inpatient status is judged to be reasonable and necessary in order to provide the required intensity of service to ensure the patient's safety. The patient's presenting symptoms, physical exam findings, and initial radiographic and laboratory data in the context of their chronic comorbidities is felt to place them at high risk for further clinical deterioration. Furthermore, it is not anticipated that the patient will be medically stable for discharge from the hospital within 2 midnights of admission. The following factors support the patient status of  inpatient.   " The patient's presenting symptoms include shortness of breath, tachypnea, tachycardia " The worrisome physical exam findings include acute respiratory disease " The initial radiographic and laboratory data are worrisome because of elevated inflammatory markers, Covid " The chronic co-morbidities include none.   * I certify that at the point of admission it is my clinical judgment that the patient will require inpatient hospital care spanning beyond 2 midnights from the point of admission due to high intensity of service, high risk for further deterioration and high frequency of surveillance required.*    Time Spent on Admission: 70 minutes     Farrell Broerman M.D. [facility] 06/24/2019, 8:13 AM

## 2019-06-24 NOTE — ED Notes (Signed)
[facility] here for transport. 

## 2019-06-24 NOTE — ED Notes (Addendum)
[facility] called for transport to [facility]. Papers at nurse's station. Attempted to call and give report to [facility], no response

## 2019-06-24 NOTE — ED Provider Notes (Signed)
[facility]-EMERGENCY DEPT Provider Note   CSN: [id number] Arrival date & time: 06/24/19  5027     History Chief Complaint  Patient presents with  . Covid+  . Shortness of Breath    Louis Kennedy is a 59 y.o. male.   59 year old male presents to the emergency department for complaints of shortness of breath.  He has had progressive worsening symptoms.  Yesterday was able to ambulate to the bathroom and would only feel winded when he reached back to his bed.  Now reports that any movement makes him feel short of breath.  He has not had any loss of consciousness.  Reports low-grade fever which has been managed with ibuprofen and Tylenol.  Last took ibuprofen 8 hours ago.  Also completed a course of azithromycin yesterday for coverage of bacterial pneumonia.    Patient with history of positive COVID-19 on 06/18/19 visible on Care Everywhere.  Believes that he was exposed with symptom onset approximately 10 days ago.  Was evaluated 2 days ago in the emergency department for similar complaints.  Had laboratory evaluation as well as CTA during this assessment.  CTA was negative for pulmonary embolus, but did show multifocal pneumonia c/w Covid infection.  PCP - [facility]  The history is provided by the patient. No language interpreter was used.  Shortness of Breath      Past Medical History:  Diagnosis Date  . Kidney stone     There are no problems to display for this patient.   History reviewed. No pertinent surgical history.     No family history on file.  Social History   Tobacco Use  . Smoking status: Never Smoker  . Smokeless tobacco: Never Used  Substance Use Topics  . Alcohol use: Never  . Drug use: Never    Home Medications Prior to Admission medications   Medication Sig Start Date End Date Taking? Authorizing Provider  amLODipine (NORVASC) 5 MG tablet Take 5 mg by mouth daily.    [provider]  atorvastatin (LIPITOR)  10 MG tablet Take 10 mg by mouth daily.    [provider]    Allergies    Penicillins  Review of Systems   Review of Systems  Respiratory: Positive for shortness of breath.   Ten systems reviewed and are negative for acute change, except as noted in the HPI.    Physical Exam Updated Vital Signs BP (!) 148/89 (BP Location: Left Arm)   Pulse (!) 118   Temp 100 F (37.8 C) (Oral)   Resp (!) 31   SpO2 95%   Physical Exam Vitals and nursing note reviewed.  Constitutional:      General: He is not in acute distress.    Appearance: He is well-developed. He is not diaphoretic.     Comments: Nontoxic appearing and in NAD  HENT:     Head: Normocephalic and atraumatic.  Eyes:     General: No scleral icterus.    Conjunctiva/sclera: Conjunctivae normal.  Cardiovascular:     Rate and Rhythm: Regular rhythm. Tachycardia present.     Pulses: Normal pulses.  Pulmonary:     Effort: Pulmonary effort is normal. No respiratory distress.     Breath sounds: No stridor.     Comments: Mild tachypnea without dyspnea. No accessory muscle use. Sats 95% on 3L via [place]. Abdominal:     Palpations: Abdomen is soft.  Musculoskeletal:        General: Normal range of motion.  Cervical back: Normal range of motion.  Skin:    General: Skin is warm and dry.     Coloration: Skin is not pale.     Findings: No erythema or rash.  Neurological:     Mental Status: He is alert and oriented to person, place, and time.     Coordination: Coordination normal.  Psychiatric:        Behavior: Behavior normal.     ED Results / Procedures / Treatments   Labs (all labs ordered are listed, but only abnormal results are displayed) Labs Reviewed  CBC WITH DIFFERENTIAL/PLATELET - Abnormal; Notable for the following components:      Result Value   WBC 16.9 (*)    Neutro Abs 15.0 (*)    Abs Immature Granulocytes 0.18 (*)    All other components within normal limits  BASIC METABOLIC PANEL - Abnormal;  Notable for the following components:   Glucose, Bld 151 (*)    All other components within normal limits  D-DIMER, QUANTITATIVE (NOT AT [facility]) - Abnormal; Notable for the following components:   D-Dimer, Quant 0.83 (*)    All other components within normal limits  LACTATE DEHYDROGENASE - Abnormal; Notable for the following components:   LDH 306 (*)    All other components within normal limits  CULTURE, BLOOD (ROUTINE X 2)  CULTURE, BLOOD (ROUTINE X 2)  LACTIC ACID, PLASMA  LACTIC ACID, PLASMA  PROCALCITONIN  FERRITIN  TRIGLYCERIDES  FIBRINOGEN  C-REACTIVE PROTEIN    EKG EKG Interpretation  Date/Time:  Monday June 24 2019 05:33:33 EST Ventricular Rate:  120 PR Interval:    QRS Duration: 83 QT Interval:  307 QTC Calculation: 434 R Axis:   -31 Text Interpretation: Sinus tachycardia Ventricular premature complex Aberrant conduction of SV complex(es) LAE, consider biatrial enlargement Left axis deviation Abnormal R-wave progression, early transition Borderline T abnormalities, anterior leads No significant change since last tracing Confirmed by Rochele Raring [phone]) on 06/24/2019 5:56:59 AM   Radiology CT Angio Chest PE W and/or Wo Contrast  Result Date: 06/22/2019 CLINICAL DATA:  Shortness of breath, elevated D-dimer level. EXAM: CT ANGIOGRAPHY CHEST WITH CONTRAST TECHNIQUE: Multidetector CT imaging of the chest was performed using the standard protocol during bolus administration of intravenous contrast. Multiplanar CT image reconstructions and MIPs were obtained to evaluate the vascular anatomy. CONTRAST:  OMNIPAQUE IOHEXOL 350 MG/ML SOLN COMPARISON:  None. FINDINGS: Cardiovascular: Satisfactory opacification of the pulmonary arteries to the segmental level. No evidence of pulmonary embolism. Normal heart size. No pericardial effusion. Mediastinum/Nodes: No enlarged mediastinal, hilar, or axillary lymph nodes. Thyroid gland, trachea, and esophagus demonstrate no significant  findings. Lungs/Pleura: No pneumothorax or pleural effusion is noted. Multiple ground-glass opacities are noted throughout both lungs consistent with multifocal pneumonia, potentially of viral etiology. Upper Abdomen: No acute abnormality. Musculoskeletal: No chest wall abnormality. No acute or significant osseous findings. Review of the MIP images confirms the above findings. IMPRESSION: 1. No definite evidence of pulmonary embolus. 2. Multiple ground-glass opacities are noted throughout both lungs consistent with multifocal pneumonia, potentially of viral etiology. Electronically Signed   By: Lupita Raider M.D.   On: 06/22/2019 09:55   DG Chest Port 1 View  Result Date: 06/24/2019 CLINICAL DATA:  Hypoxia. Shortness of breath.  COVID-19 positive. EXAM: PORTABLE CHEST 1 VIEW COMPARISON:  Radiographs and CTA 06/22/2019 FINDINGS: Persistent low lung volumes. Worsening bilateral airspace disease primarily at the left lung base. The heart is normal in size. Normal mediastinal contours. No pneumothorax  or large pleural effusion. IMPRESSION: Worsening bilateral airspace disease consistent with COVID pneumonia. Electronically Signed   By: Keith Rake M.D.   On: 06/24/2019 05:57   DG Chest Port 1 View  Result Date: 06/22/2019 CLINICAL DATA:  COVID-19 positive. EXAM: PORTABLE CHEST 1 VIEW COMPARISON:  None. FINDINGS: Confluent airspace disease at the right lung base, with additional vague patchy opacities in the right mid lung and left lower lung zone. The heart is normal in size. No pulmonary edema, pleural effusion, or pneumothorax. No acute osseous abnormalities are seen. IMPRESSION: Vague patchy opacities in both lungs, pattern consistent with COVID-19 pneumonia. More confluent right lower lobe opacity may represent COVID or superimposed bacterial infection. Electronically Signed   By: Keith Rake M.D.   On: 06/22/2019 06:20    Procedures .Critical Care Performed by: Antonietta Breach, PA-C Authorized  by: Antonietta Breach, PA-C   Critical care provider statement:    Critical care time (minutes):  45   Critical care was necessary to treat or prevent imminent or life-threatening deterioration of the following conditions:  Respiratory failure   Critical care was time spent personally by me on the following activities:  Discussions with consultants, evaluation of patient's response to treatment, examination of patient, ordering and performing treatments and interventions, ordering and review of laboratory studies, ordering and review of radiographic studies, pulse oximetry, re-evaluation of patient's condition, obtaining history from patient or surrogate and review of old charts   (including critical care time)  Medications Ordered in ED Medications  acetaminophen (TYLENOL) tablet 1,000 mg (has no administration in time range)  0.9 %  sodium chloride infusion (has no administration in time range)    ED Course  I have reviewed the triage vital signs and the nursing notes.  Pertinent labs & imaging results that were available during my care of the patient were reviewed by me and considered in my medical decision making (see chart for details).    MDM Rules/Calculators/A&P                       59 year old male with recent COVID positive test on 06/18/2019 presents to the emergency department for worsening shortness of breath.  Found to have sats of 86% on room air with tachycardia and tachypnea on arrival.  Sats presently 95% on 3 L via nasal cannula.  Given Tylenol for low-grade fever.  Will admit to [facility] for ongoing management of ARF 2/2 COVID.  Louis Kennedy was evaluated in Emergency Department on 06/24/2019 for the symptoms described in the history of present illness. He was evaluated in the context of the global COVID-19 pandemic, which necessitated consideration that the patient might be at risk for infection with the SARS-CoV-2 virus that causes COVID-19. Institutional protocols and  algorithms that pertain to the evaluation of patients at risk for COVID-19 are in a state of rapid change based on information released by regulatory bodies including the CDC and federal and state organizations. These policies and algorithms were followed during the patient's care in the ED.   Final Clinical Impression(s) / ED Diagnoses Final diagnoses:  Acute respiratory failure with hypoxia ([place])  COVID-19 virus infection    Rx / DC Orders ED Discharge Orders    None       Antonietta Breach, PA-C 06/24/19 [place], Delice Bison, DO 06/24/19 [phone]

## 2019-06-24 NOTE — ED Triage Notes (Addendum)
Pt reports that he is Covid+ and "getting worse". Reports SOB esp with exertion. Reports been taking Ibuprofen and tylenol for fevers. Last dose was 8 hours ago. Pt reports finished his antibiotics yesterday for PNA.

## 2019-06-25 ENCOUNTER — Encounter (HOSPITAL_COMMUNITY): Payer: Self-pay | Admitting: Internal Medicine

## 2019-06-25 LAB — COMPREHENSIVE METABOLIC PANEL WITH GFR
ALT: 147 U/L — ABNORMAL HIGH (ref 0–44)
AST: 64 U/L — ABNORMAL HIGH (ref 15–41)
Albumin: 3.1 g/dL — ABNORMAL LOW (ref 3.5–5.0)
Alkaline Phosphatase: 97 U/L (ref 38–126)
Anion gap: 13 (ref 5–15)
BUN: 17 mg/dL (ref 6–20)
CO2: 27 mmol/L (ref 22–32)
Calcium: 10.5 mg/dL — ABNORMAL HIGH (ref 8.9–10.3)
Chloride: 100 mmol/L (ref 98–111)
Creatinine, Ser: 1.06 mg/dL (ref 0.61–1.24)
GFR calc Af Amer: >60 mL/min
GFR calc non Af Amer: >60 mL/min
Glucose, Bld: 106 mg/dL — ABNORMAL HIGH (ref 70–99)
Potassium: 4.2 mmol/L (ref 3.5–5.1)
Sodium: 140 mmol/L (ref 135–145)
Total Bilirubin: 0.5 mg/dL (ref 0.3–1.2)
Total Protein: 6.7 g/dL (ref 6.5–8.1)

## 2019-06-25 LAB — CBC WITH DIFFERENTIAL/PLATELET
Abs Immature Granulocytes: 0.21 K/uL — ABNORMAL HIGH (ref 0.00–0.07)
Basophils Absolute: 0.1 K/uL (ref 0.0–0.1)
Basophils Relative: 0 %
Eosinophils Absolute: 0.1 K/uL (ref 0.0–0.5)
Eosinophils Relative: 0 %
HCT: 45.6 % (ref 39.0–52.0)
Hemoglobin: 14.4 g/dL (ref 13.0–17.0)
Immature Granulocytes: 1 %
Lymphocytes Relative: 6 %
Lymphs Abs: 1.1 K/uL (ref 0.7–4.0)
MCH: 26.7 pg (ref 26.0–34.0)
MCHC: 31.6 g/dL (ref 30.0–36.0)
MCV: 84.4 fL (ref 80.0–100.0)
Monocytes Absolute: 0.9 K/uL (ref 0.1–1.0)
Monocytes Relative: 5 %
Neutro Abs: 16.9 K/uL — ABNORMAL HIGH (ref 1.7–7.7)
Neutrophils Relative %: 88 %
Platelets: 261 K/uL (ref 150–400)
RBC: 5.4 MIL/uL (ref 4.22–5.81)
RDW: 12.8 % (ref 11.5–15.5)
WBC: 19.3 K/uL — ABNORMAL HIGH (ref 4.0–10.5)
nRBC: 0 % (ref 0.0–0.2)

## 2019-06-25 LAB — C-REACTIVE PROTEIN: CRP: 24 mg/dL — ABNORMAL HIGH

## 2019-06-25 LAB — FERRITIN: Ferritin: 837 ng/mL — ABNORMAL HIGH (ref 24–336)

## 2019-06-25 LAB — D-DIMER, QUANTITATIVE: D-Dimer, Quant: 0.81 ug/mL-FEU — ABNORMAL HIGH (ref 0.00–0.50)

## 2019-06-25 MED ORDER — METHYLPREDNISOLONE SODIUM SUCC 40 MG IJ SOLR
40.0000 mg | Freq: Two times a day (BID) | INTRAMUSCULAR | Status: DC
  Administered 2019-06-25 – 2019-06-26 (×2): 40 mg via INTRAVENOUS
  Filled 2019-06-25 (×2): qty 1

## 2019-06-25 NOTE — Progress Notes (Signed)
Spoke with patient's wife and updated her on patient's status.

## 2019-06-25 NOTE — Progress Notes (Signed)
PROGRESS NOTE    Louis Kennedy  KXF:[id number] DOB: 08-30-1960 DOA: 06/24/2019 PCP: Patient, No Pcp Per   Brief Narrative:  Patient is Louis Kennedy 59 year old male with history of hypertension, hyperlipidemia presented to ED with COVID-19.  Patient reports that he was in [place] working for [employer] project for his company for last 1 month.  He had taken full precautions however 2 weeks ago he had to go to the ER for muscle spasm and pain in his legs.  He thinks that is how he got the Covid.  10 days ago he started having productive coughing, generalized weakness, nausea and diarrhea.  Diarrhea is currently improving.  6 days ago he had Covid test done which was positive in [place].  He returned back to [facility] on December 31,2020.  Patient reported that his symptoms have been worsening, he initially could walk to his bathroom however now he he feels short of breath with minimal exertion.  He was started on Zithromax outpatient.  Also reported fevers and rigors at home.  Assessment & Plan:   Principal Problem:   Acute respiratory disease due to COVID-19 virus  Acute hypoxic respiratory failure due to acute COVID-19 viral pneumonia during the ongoing 2020 COVID-19 pandemic- POA, sepsis - improved resp status, on 2 L [place] - continue steroids/remdesivir - s/p actemra 1/4 - on levaquin, low suspicion for bacterial pneumonia, will discontinue - CRP 24.0 - I/O, daily weights - Prone as able, IS, OOB  COVID-19 Labs  Recent Labs    06/24/19 0542 06/24/19 0543 06/25/19 0519  DDIMER 0.83*  --  0.81*  FERRITIN  --  908* 837*  LDH 306*  --   --   CRP  --  22.9* 24.0*    Lab Results  Component Value Date   SARSCOV2NAA POSITIVE (Jodi Criscuolo) 06/24/2019   Essential hypertension with tachycardia - will place on Lopressor 25 mg twice daily  Hyperlipidemia Hold statin with LFT's  Elevated LFT's: likely 2/2 covid, continue to monitor  Hypercalcemia: continue to monitor  DVT prophylaxis: lovenox Code  Status: full  Family Communication: none at bedside Disposition Plan: pending further improvement  Consultants:   none  Procedures:  none  Antimicrobials:  Anti-infectives (From admission, onward)   Start     Dose/Rate Route Frequency Ordered Stop   06/25/19 1000  remdesivir 100 mg in sodium chloride 0.9 % 100 mL IVPB  Status:  Discontinued     100 mg 200 mL/hr over 30 Minutes Intravenous Daily 06/24/19 0826 06/24/19 0827   06/25/19 1000  remdesivir 100 mg in sodium chloride 0.9 % 100 mL IVPB     100 mg 200 mL/hr over 30 Minutes Intravenous Daily 06/24/19 0827 06/29/19 0959   06/24/19 1000  levofloxacin (LEVAQUIN) tablet 750 mg  Status:  Discontinued     750 mg Oral Daily 06/24/19 0917 06/25/19 1811   06/24/19 0900  remdesivir 200 mg in sodium chloride 0.9% 250 mL IVPB     200 mg 580 mL/hr over 30 Minutes Intravenous Once 06/24/19 0827 06/24/19 1028   06/24/19 0826  remdesivir 200 mg in sodium chloride 0.9% 250 mL IVPB  Status:  Discontinued     200 mg 580 mL/hr over 30 Minutes Intravenous Once 06/24/19 0826 06/24/19 0827     Subjective: Feeling better today  Objective: Vitals:   06/25/19 0118 06/25/19 0400 06/25/19 0740 06/25/19 1650  BP:  123/74 112/63 115/60  Pulse: 97 85    Resp: (!) 24 (!) 23  Temp:  98.5 F (36.9 C) 98.1 F (36.7 C) 98 F (36.7 C)  TempSrc:  Oral Oral Oral  SpO2: 93% 92% 94%   Weight:      Height:        Intake/Output Summary (Last 24 hours) at 06/25/2019 1809 Last data filed at 06/25/2019 1705 Gross per 24 hour  Intake 943 ml  Output 1210 ml  Net -267 ml   Filed Weights   06/24/19 2205  Weight: 70.2 kg    Examination:  General exam: Appears calm and comfortable.  Standing up, stretching his legs.  Respiratory system: Clear to auscultation. Respiratory effort normal. Cardiovascular system: S1 & S2 heard, RRR.  Gastrointestinal system: Abdomen is nondistended, soft and nontender.  Central nervous system: Alert and oriented. No  focal neurological deficits. Extremities: no LEE Skin: No rashes, lesions or ulcers Psychiatry: Judgement and insight appear normal. Mood & affect appropriate.     Data Reviewed: I have personally reviewed following labs and imaging studies  CBC: Recent Labs  Lab 06/22/19 0651 06/24/19 0542 06/25/19 0519  WBC 10.9* 16.9* 19.3*  NEUTROABS 9.6* 15.0* 16.9*  HGB 14.8 13.9 14.4  HCT 46.3 43.4 45.6  MCV 85.1 84.1 84.4  PLT 146* 204 161   Basic Metabolic Panel: Recent Labs  Lab 06/22/19 0651 06/24/19 0542 06/24/19 0931 06/25/19 0519  NA 135 135  --  140  K 3.5 3.9  --  4.2  CL 98 99  --  100  CO2 27 27  --  27  GLUCOSE 109* 151*  --  106*  BUN 11 9  --  17  CREATININE 0.97 1.02 0.91 1.06  CALCIUM 9.7 10.0  --  10.5*   GFR: Estimated Creatinine Clearance: 72.8 mL/min (by C-G formula based on SCr of 1.06 mg/dL). Liver Function Tests: Recent Labs  Lab 06/22/19 0651 06/24/19 0931 06/25/19 0519  AST 44* 116* 64*  ALT 72* 191* 147*  ALKPHOS 63 92 97  BILITOT 0.9 0.9 0.5  PROT 6.8 6.7 6.7  ALBUMIN 3.6 3.0* 3.1*   No results for input(s): LIPASE, AMYLASE in the last 168 hours. No results for input(s): AMMONIA in the last 168 hours. Coagulation Profile: No results for input(s): INR, PROTIME in the last 168 hours. Cardiac Enzymes: No results for input(s): CKTOTAL, CKMB, CKMBINDEX, TROPONINI in the last 168 hours. BNP (last 3 results) No results for input(s): PROBNP in the last 8760 hours. HbA1C: No results for input(s): HGBA1C in the last 72 hours. CBG: No results for input(s): GLUCAP in the last 168 hours. Lipid Profile: Recent Labs    06/24/19 0543  TRIG 46   Thyroid Function Tests: No results for input(s): TSH, T4TOTAL, FREET4, T3FREE, THYROIDAB in the last 72 hours. Anemia Panel: Recent Labs    06/24/19 0543 06/25/19 0519  FERRITIN 908* 837*   Sepsis Labs: Recent Labs  Lab 06/22/19 0651 06/24/19 0540 06/24/19 0542  PROCALCITON <0.10  --  0.25    LATICACIDVEN 1.6  1.0 1.2  --     Recent Results (from the past 240 hour(s))  Blood Culture (routine x 2)     Status: None (Preliminary result)   Collection Time: 06/22/19  6:48 AM   Specimen: BLOOD  Result Value Ref Range Status   Specimen Description   Final    BLOOD LEFT ANTECUBITAL Performed at [facility], [address] [place], [place] [address]    Special Requests   Final    BOTTLES DRAWN AEROBIC  AND ANAEROBIC Blood Culture results may not be optimal due to an excessive volume of blood received in culture bottles Performed at [facility], [address] [place], [place] [address]    Culture   Final    NO GROWTH 3 DAYS Performed at [facility], [address] [place], [place] [address]    Report Status PENDING  Incomplete  Blood Culture (routine x 2)     Status: None (Preliminary result)   Collection Time: 06/22/19  7:00 AM   Specimen: BLOOD  Result Value Ref Range Status   Specimen Description   Final    BLOOD LEFT ANTECUBITAL Performed at [facility], [address] [place], [place] [address]    Special Requests   Final    BOTTLES DRAWN AEROBIC AND ANAEROBIC Blood Culture results may not be optimal due to an excessive volume of blood received in culture bottles Performed at [facility], [address] [place], [place] [address]    Culture   Final    NO GROWTH 3 DAYS Performed at [facility], [address] [place], [place] [address]    Report Status PENDING  Incomplete  Blood Culture (routine x 2)     Status: None (Preliminary result)   Collection Time: 06/24/19  5:40 AM   Specimen: BLOOD  Result Value Ref Range Status   Specimen Description   Final    BLOOD LEFT ARM Performed at [facility], [address] [place], [place] [address]    Special Requests   Final    BOTTLES DRAWN AEROBIC AND ANAEROBIC Blood Culture adequate volume Performed at [facility], [address], [place], [place] [address]    Culture   Final    NO GROWTH 1 DAY Performed at [facility], [address], [place], [place] [address]    Report Status PENDING  Incomplete  Blood Culture (routine x 2)     Status: None (Preliminary result)   Collection Time: 06/24/19  5:45 AM   Specimen: BLOOD  Result Value Ref Range Status   Specimen Description   Final    BLOOD RIGHT ANTECUBITAL Performed at [facility], [address], [place], [place] [address]    Special Requests   Final    BOTTLES DRAWN AEROBIC AND ANAEROBIC Blood Culture adequate volume Performed at [facility], [address], [place], [place] [address]    Culture   Final    NO GROWTH 1 DAY Performed at [facility], [address] [place], [place] [address]    Report Status PENDING  Incomplete  SARS CORONAVIRUS 2 (TAT 6-24 HRS) Nasopharyngeal Nasopharyngeal Swab     Status: Abnormal   Collection Time: 06/24/19  7:38 AM   Specimen: Nasopharyngeal Swab  Result Value Ref Range Status   SARS Coronavirus 2 POSITIVE (Brenetta Penny) NEGATIVE Final    Comment: RESULT CALLED TO, READ BACK BY AND VERIFIED WITH: K.ZULETA RN 1731 06/24/19 MCCORMICK K (NOTE) SARS-CoV-2 target nucleic acids are DETECTED. The SARS-CoV-2 RNA is generally detectable in upper and lower respiratory specimens during the acute phase of infection. Positive results are indicative of the presence of SARS-CoV-2 RNA. Clinical correlation with patient history and other diagnostic information is  necessary to determine patient infection status. Positive results do not rule out bacterial infection or co-infection with other viruses.  The expected result is Negative. Fact Sheet  for Patients: HairSlick.no Fact Sheet for Healthcare Providers: quierodirigir.com This test is not yet approved or cleared by the [place] FDA and  has been  authorized for detection and/or diagnosis of SARS-CoV-2 by FDA under an Emergency Use Authorization (EUA). This EUA will remain  in effect (meaning this test can be used) for t he duration of the COVID-19 declaration under Section 564(b)(1) of the Act, 21 U.S.C. section 360bbb-3(b)(1), unless the authorization is terminated or revoked sooner. Performed at [facility], [address], [place], [place] [address]          Radiology Studies: DG Chest Port 1 View  Result Date: 06/24/2019 CLINICAL DATA:  Hypoxia. Shortness of breath.  COVID-19 positive. EXAM: PORTABLE CHEST 1 VIEW COMPARISON:  Radiographs and CTA 06/22/2019 FINDINGS: Persistent low lung volumes. Worsening bilateral airspace disease primarily at the left lung base. The heart is normal in size. Normal mediastinal contours. No pneumothorax or large pleural effusion. IMPRESSION: Worsening bilateral airspace disease consistent with COVID pneumonia. Electronically Signed   By: Narda Rutherford M.D.   On: 06/24/2019 05:57        Scheduled Meds: . albuterol  2 puff Inhalation Q6H  . vitamin C  500 mg Oral Daily  . atorvastatin  10 mg Oral Daily  . enoxaparin (LOVENOX) injection  40 mg Subcutaneous Q24H  . levofloxacin  750 mg Oral Daily  . methylPREDNISolone (SOLU-MEDROL) injection  40 mg Intravenous Q12H  . metoprolol tartrate  25 mg Oral BID  . sodium chloride flush  3 mL Intravenous Q12H  . zinc sulfate  220 mg Oral Daily   Continuous Infusions: . sodium chloride    . remdesivir 100 mg in NS 100 mL 100 mg (06/25/19 1017)     LOS: 1 day    Time spent: over 30 min    Lacretia Nicks, MD [facility] Pager AMION  If 7PM-7AM, please contact night-coverage [email or web address] Password [facility] 06/25/2019, 6:09 PM

## 2019-06-26 DIAGNOSIS — J1282 Pneumonia due to coronavirus disease 2019: Secondary | ICD-10-CM

## 2019-06-26 LAB — CBC WITH DIFFERENTIAL/PLATELET
Abs Immature Granulocytes: 0.12 10*3/uL — ABNORMAL HIGH (ref 0.00–0.07)
Basophils Absolute: 0 10*3/uL (ref 0.0–0.1)
Basophils Relative: 0 %
Eosinophils Absolute: 0 10*3/uL (ref 0.0–0.5)
Eosinophils Relative: 0 %
HCT: 42.2 % (ref 39.0–52.0)
Hemoglobin: 13.7 g/dL (ref 13.0–17.0)
Immature Granulocytes: 1 %
Lymphocytes Relative: 4 %
Lymphs Abs: 0.7 10*3/uL (ref 0.7–4.0)
MCH: 27.3 pg (ref 26.0–34.0)
MCHC: 32.5 g/dL (ref 30.0–36.0)
MCV: 84.1 fL (ref 80.0–100.0)
Monocytes Absolute: 0.3 10*3/uL (ref 0.1–1.0)
Monocytes Relative: 2 %
Neutro Abs: 14.8 10*3/uL — ABNORMAL HIGH (ref 1.7–7.7)
Neutrophils Relative %: 93 %
Platelets: 307 10*3/uL (ref 150–400)
RBC: 5.02 MIL/uL (ref 4.22–5.81)
RDW: 12.9 % (ref 11.5–15.5)
WBC: 16 10*3/uL — ABNORMAL HIGH (ref 4.0–10.5)
nRBC: 0 % (ref 0.0–0.2)

## 2019-06-26 LAB — COMPREHENSIVE METABOLIC PANEL
ALT: 112 U/L — ABNORMAL HIGH (ref 0–44)
AST: 44 U/L — ABNORMAL HIGH (ref 15–41)
Albumin: 2.7 g/dL — ABNORMAL LOW (ref 3.5–5.0)
Alkaline Phosphatase: 91 U/L (ref 38–126)
Anion gap: 11 (ref 5–15)
BUN: 24 mg/dL — ABNORMAL HIGH (ref 6–20)
CO2: 28 mmol/L (ref 22–32)
Calcium: 10.2 mg/dL (ref 8.9–10.3)
Chloride: 100 mmol/L (ref 98–111)
Creatinine, Ser: 0.98 mg/dL (ref 0.61–1.24)
GFR calc Af Amer: >60 mL/min (ref >60)
GFR calc non Af Amer: >60 mL/min (ref >60)
Glucose, Bld: 116 mg/dL — ABNORMAL HIGH (ref 70–99)
Potassium: 4.4 mmol/L (ref 3.5–5.1)
Sodium: 139 mmol/L (ref 135–145)
Total Bilirubin: 0.8 mg/dL (ref 0.3–1.2)
Total Protein: 6.3 g/dL — ABNORMAL LOW (ref 6.5–8.1)

## 2019-06-26 LAB — C-REACTIVE PROTEIN: CRP: 12 mg/dL — ABNORMAL HIGH (ref <1.0)

## 2019-06-26 LAB — PHOSPHORUS: Phosphorus: 3.9 mg/dL (ref 2.5–4.6)

## 2019-06-26 LAB — MAGNESIUM: Magnesium: 2.2 mg/dL (ref 1.7–2.4)

## 2019-06-26 LAB — FERRITIN: Ferritin: 762 ng/mL — ABNORMAL HIGH (ref 24–336)

## 2019-06-26 LAB — D-DIMER, QUANTITATIVE: D-Dimer, Quant: 0.55 ug/mL-FEU — ABNORMAL HIGH (ref 0.00–0.50)

## 2019-06-26 MED ORDER — DEXAMETHASONE 6 MG PO TABS
6.0000 mg | ORAL_TABLET | Freq: Every day | ORAL | Status: DC
  Administered 2019-06-26 – 2019-06-27 (×2): 6 mg via ORAL
  Filled 2019-06-26 (×2): qty 1

## 2019-06-26 NOTE — Progress Notes (Addendum)
SATURATION QUALIFICATIONS: (This note is used to comply with regulatory documentation for home oxygen)  Patient Saturations on Room Air at Rest = 99%  Patient Saturations on Room Air while Ambulating = 78-99%  Patient Saturations on 2-4 Liters of oxygen while Ambulating = 90-100%  Please briefly explain why patient needs home oxygen:  Upon entering room, pt on RA. Saturating in high 90's. Saturations WNL until we left pt room. Sats went as low as 78% on RA with ambulation. Pt placed on 2L. Sats went from 96 to 85%. Pt placed on 3L. Sats went from 94-87%. Pt then placed on 4L. Sats stayed within high 90's to 100. Pt then decreased down to 3L. Sats stayed WNL until back in room. Pt placed back on RA and sats at 96% at rest after ambulation.

## 2019-06-26 NOTE — Progress Notes (Signed)
PROGRESS NOTE    Louis Kennedy  ENI:[id number] DOB: 1961/05/20 DOA: 06/24/2019 PCP: Patient, No Pcp Per   Brief Narrative:  59 year old male withhistory of hypertension, hyperlipidemiapresented to ED with COVID-19. 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 He had taken full precautions however 2 weeks ago he had to go to the ER for muscle spasm and pain in his legs. He thinks that is how he got the Covid. 10 days ago he started having productive coughing, generalized weakness, nausea and diarrhea. Diarrhea is currently improving. 6 days ago he had Covid test done which was positive in [place]. He returned back to [facility] on December 31,2020.Patient reported that his symptoms have been worsening, he initially could walk to his bathroom however now he he feels short of breath with minimal exertion. He was started on Zithromax outpatient. Also reported fevers and rigors at home.   Subjective: A/O x4, afebrile overnight positive S OB   Assessment & Plan:   Principal Problem:   Acute respiratory disease due to COVID-19 virus  Covid pneumonia/acute respiratory failure with hypoxia COVID-19 Labs  Recent Labs    06/24/19 0542 06/24/19 0543 06/25/19 0519 06/26/19 0130  DDIMER 0.83*  --  0.81* 0.55*  FERRITIN  --  908* 837* 762*  LDH 306*  --   --   --   CRP  --  22.9* 24.0* 12.0*    Lab Results  Component Value Date   SARSCOV2NAA POSITIVE (A) 06/24/2019   -Decadron p.o. 6 mg daily -Remdesivir per pharmacy protocol -1/4 Actemra x1 dose SATURATION QUALIFICATIONS: (This note is used to comply with regulatory documentation for home oxygen) Patient Saturations on Room Air at Rest = 99% Patient Saturations on Room Air while Ambulating = 78-99% Patient Saturations on 2-4 Liters of oxygen while Ambulating = 90-100% Please briefly explain why patient needs home oxygen:  Upon entering room, pt on RA. Saturating in  high 90's. Saturations WNL until we left pt room. Sats went as low as 78% on RA with ambulation. Pt placed on 2L. Sats went from 96 to 85%. Pt placed on 3L. Sats went from 94-87%. Pt then placed on 4L. Sats stayed within high 90's to 100. Pt then decreased down to 3L. Sats stayed WNL until back in room. Pt placed back on RA and sats at 96% at rest after ambulation -2 L O2 titrate O2 to maintain SPO2> 88% -Provide Inogen portable home O2 concentrator   Essential hypertension with tachycardia -will place on Lopressor 25 mg twice daily  Hyperlipidemia Hold statin with LFT's  Elevated LFT's: l -Remains slightly elevated but improving   Hypercalcemia:  -Improving        DVT prophylaxis: Lovenox Code Status: Full Family Communication:  Disposition Plan: Discharge 1/7 to complete remdesivir at outpatient clinic   Consultants:    Procedures/Significant Events:     I have personally reviewed and interpreted all radiology studies and my findings are as above.  VENTILATOR SETTINGS: Nasal cannula Flow; 3.5 L/min SPO2; 96%   Cultures   Antimicrobials: Anti-infectives (From admission, onward)   Start     Dose/Rate Stop   06/25/19 1000  remdesivir 100 mg in sodium chloride 0.9 % 100 mL IVPB  Status:  Discontinued     100 mg 200 mL/hr over 30 Minutes 06/24/19 0827   06/25/19 1000  remdesivir 100 mg in sodium chloride 0.9 % 100 mL IVPB     100  mg 200 mL/hr over 30 Minutes 06/29/19 0959   06/24/19 1000  levofloxacin (LEVAQUIN) tablet 750 mg  Status:  Discontinued     750 mg 06/25/19 1811   06/24/19 0900  remdesivir 200 mg in sodium chloride 0.9% 250 mL IVPB     200 mg 580 mL/hr over 30 Minutes 06/24/19 1028   06/24/19 0826  remdesivir 200 mg in sodium chloride 0.9% 250 mL IVPB  Status:  Discontinued     200 mg 580 mL/hr over 30 Minutes 06/24/19 0827       Devices    LINES / TUBES:      Continuous Infusions: . sodium chloride    . remdesivir 100 mg in NS  100 mL 100 mg (06/26/19 1041)     Objective: Vitals:   06/26/19 0810 06/26/19 1000 06/26/19 1130 06/26/19 1148  BP: 104/65 126/87  115/82  Pulse:  (!) 106 88 81  Resp:  (!) 38 (!) 24 (!) 25  Temp:    98 F (36.7 C)  TempSrc:    Oral  SpO2: 96% 93% 96% 95%  Weight:      Height:        Intake/Output Summary (Last 24 hours) at 06/26/2019 1336 Last data filed at 06/26/2019 0810 Gross per 24 hour  Intake 613 ml  Output 1045 ml  Net -432 ml   Filed Weights   06/24/19 2205  Weight: 70.2 kg    Examination:  General: A/O x4, positive acute respiratory distress Eyes: negative scleral hemorrhage, negative anisocoria, negative icterus ENT: Negative Runny nose, negative gingival bleeding, Neck:  Negative scars, masses, torticollis, lymphadenopathy, JVD Lungs: Decreased breath sounds bilaterally without wheezes or crackles Cardiovascular: Regular rate and rhythm without murmur gallop or rub normal S1 and S2 Abdomen: negative abdominal pain, nondistended, positive soft, bowel sounds, no rebound, no ascites, no appreciable mass Extremities: No significant cyanosis, clubbing, or edema bilateral lower extremities Skin: Negative rashes, lesions, ulcers Psychiatric:  Negative depression, negative anxiety, negative fatigue, negative mania  Central nervous system:  Cranial nerves II through XII intact, tongue/uvula midline, all extremities muscle strength 5/5, sensation intact throughout, negative dysarthria, negative expressive aphasia, negative receptive aphasia.  .     Data Reviewed: Care during the described time interval was provided by me .  I have reviewed this patient's available data, including medical history, events of note, physical examination, and all test results as part of my evaluation.   CBC: Recent Labs  Lab 06/22/19 0651 06/24/19 0542 06/25/19 0519 06/26/19 0130  WBC 10.9* 16.9* 19.3* 16.0*  NEUTROABS 9.6* 15.0* 16.9* 14.8*  HGB 14.8 13.9 14.4 13.7  HCT 46.3 43.4  45.6 42.2  MCV 85.1 84.1 84.4 84.1  PLT 146* 204 261 323   Basic Metabolic Panel: Recent Labs  Lab 06/22/19 0651 06/24/19 0542 06/24/19 0931 06/25/19 0519 06/26/19 0130  NA 135 135  --  140 139  K 3.5 3.9  --  4.2 4.4  CL 98 99  --  100 100  CO2 27 27  --  27 28  GLUCOSE 109* 151*  --  106* 116*  BUN 11 9  --  17 24*  CREATININE 0.97 1.02 0.91 1.06 0.98  CALCIUM 9.7 10.0  --  10.5* 10.2  MG  --   --   --   --  2.2  PHOS  --   --   --   --  3.9   GFR: Estimated Creatinine Clearance: 78.7 mL/min (by C-G formula based  on SCr of 0.98 mg/dL). Liver Function Tests: Recent Labs  Lab 06/22/19 0651 06/24/19 0931 06/25/19 0519 06/26/19 0130  AST 44* 116* 64* 44*  ALT 72* 191* 147* 112*  ALKPHOS 63 92 97 91  BILITOT 0.9 0.9 0.5 0.8  PROT 6.8 6.7 6.7 6.3*  ALBUMIN 3.6 3.0* 3.1* 2.7*   No results for input(s): LIPASE, AMYLASE in the last 168 hours. No results for input(s): AMMONIA in the last 168 hours. Coagulation Profile: No results for input(s): INR, PROTIME in the last 168 hours. Cardiac Enzymes: No results for input(s): CKTOTAL, CKMB, CKMBINDEX, TROPONINI in the last 168 hours. BNP (last 3 results) No results for input(s): PROBNP in the last 8760 hours. HbA1C: No results for input(s): HGBA1C in the last 72 hours. CBG: No results for input(s): GLUCAP in the last 168 hours. Lipid Profile: Recent Labs    06/24/19 0543  TRIG 46   Thyroid Function Tests: No results for input(s): TSH, T4TOTAL, FREET4, T3FREE, THYROIDAB in the last 72 hours. Anemia Panel: Recent Labs    06/25/19 0519 06/26/19 0130  FERRITIN 837* 762*   Urine analysis: No results found for: COLORURINE, APPEARANCEUR, LABSPEC, PHURINE, GLUCOSEU, HGBUR, BILIRUBINUR, KETONESUR, PROTEINUR, UROBILINOGEN, NITRITE, LEUKOCYTESUR Sepsis Labs: @LABRCNTIP (procalcitonin:4,lacticidven:4)  ) Recent Results (from the past 240 hour(s))  Blood Culture (routine x 2)     Status: None (Preliminary result)    Collection Time: 06/22/19  6:48 AM   Specimen: BLOOD  Result Value Ref Range Status   Specimen Description   Final    BLOOD LEFT ANTECUBITAL Performed at [facility], [address] [place], [place]    Special Requests   Final    BOTTLES DRAWN AEROBIC AND ANAEROBIC Blood Culture results may not be optimal due to an excessive volume of blood received in culture bottles Performed at [facility], [address] [place], [place]    Culture   Final    NO GROWTH 3 DAYS Performed at [facility], [address] [place], [place]    Report Status PENDING  Incomplete  Blood Culture (routine x 2)     Status: None (Preliminary result)   Collection Time: 06/22/19  7:00 AM   Specimen: BLOOD  Result Value Ref Range Status   Specimen Description   Final    BLOOD LEFT ANTECUBITAL Performed at [facility], [address] [place], [place]    Special Requests   Final    BOTTLES DRAWN AEROBIC AND ANAEROBIC Blood Culture results may not be optimal due to an excessive volume of blood received in culture bottles Performed at [facility], [address] [place], [place]    Culture   Final    NO GROWTH 3 DAYS Performed at [facility], [address], [place], [place]    Report Status PENDING  Incomplete  Blood Culture (routine x 2)     Status: None (Preliminary result)   Collection Time: 06/24/19  5:40 AM   Specimen: BLOOD  Result Value Ref Range Status   Specimen Description   Final    BLOOD LEFT ARM Performed at [facility], [address] [place], [place]    Special Requests   Final    BOTTLES DRAWN AEROBIC AND ANAEROBIC Blood Culture adequate volume Performed at [facility], [address] [place], [place]  Culture   Final    NO GROWTH 1 DAY Performed at [facility], [address]  [place], [place] [address]    Report Status PENDING  Incomplete  Blood Culture (routine x 2)     Status: None (Preliminary result)   Collection Time: 06/24/19  5:45 AM   Specimen: BLOOD  Result Value Ref Range Status   Specimen Description   Final    BLOOD RIGHT ANTECUBITAL Performed at [facility], [address] [place], [place] [address]    Special Requests   Final    BOTTLES DRAWN AEROBIC AND ANAEROBIC Blood Culture adequate volume Performed at [facility], [address], [place], [place] [address]    Culture   Final    NO GROWTH 1 DAY Performed at [facility], [address], [place], [place] [address]    Report Status PENDING  Incomplete  SARS CORONAVIRUS 2 (TAT 6-24 HRS) Nasopharyngeal Nasopharyngeal Swab     Status: Abnormal   Collection Time: 06/24/19  7:38 AM   Specimen: Nasopharyngeal Swab  Result Value Ref Range Status   SARS Coronavirus 2 POSITIVE (A) NEGATIVE Final    Comment: RESULT CALLED TO, READ BACK BY AND VERIFIED WITH: K.ZULETA RN [id number] 06/24/19 MCCORMICK K (NOTE) SARS-CoV-2 target nucleic acids are DETECTED. The SARS-CoV-2 RNA is generally detectable in upper and lower respiratory specimens during the acute phase of infection. Positive results are indicative of the presence of SARS-CoV-2 RNA. Clinical correlation with patient history and other diagnostic information is  necessary to determine patient infection status. Positive results do not rule out bacterial infection or co-infection with other viruses.  The expected result is Negative. Fact Sheet for Patients: HairSlick.no Fact Sheet for Healthcare Providers: quierodirigir.com This test is not yet approved or cleared by the [place] FDA and  has been authorized for detection and/or diagnosis of SARS-CoV-2 by FDA under an Emergency Use Authorization (EUA). This EUA will remain  in effect (meaning this  test can be used) for t he duration of the COVID-19 declaration under Section 564(b)(1) of the Act, 21 U.S.C. section 360bbb-3(b)(1), unless the authorization is terminated or revoked sooner. Performed at [facility], [address] [place], [place] [address]          Radiology Studies: No results found.      Scheduled Meds: . albuterol  2 puff Inhalation Q6H  . vitamin C  500 mg Oral Daily  . enoxaparin (LOVENOX) injection  40 mg Subcutaneous Q24H  . methylPREDNISolone (SOLU-MEDROL) injection  40 mg Intravenous Q12H  . metoprolol tartrate  25 mg Oral BID  . sodium chloride flush  3 mL Intravenous Q12H  . zinc sulfate  220 mg Oral Daily   Continuous Infusions: . sodium chloride    . remdesivir 100 mg in NS 100 mL 100 mg (06/26/19 1041)     LOS: 2 days   The patient is critically ill with multiple organ systems failure and requires high complexity decision making for assessment and support, frequent evaluation and titration of therapies, application of advanced monitoring technologies and extensive interpretation of multiple databases. Critical Care Time devoted to patient care services described in this note  Time spent: 40 minutes     Koleton Duchemin, Roselind Messier, MD [facility] Pager [phone]  If 7PM-7AM, please contact night-coverage [email or web address] Password [email or web address] 06/26/2019, 1:36 PM

## 2019-06-27 LAB — COMPREHENSIVE METABOLIC PANEL
ALT: 113 U/L — ABNORMAL HIGH (ref 0–44)
AST: 43 U/L — ABNORMAL HIGH (ref 15–41)
Albumin: 2.8 g/dL — ABNORMAL LOW (ref 3.5–5.0)
Alkaline Phosphatase: 89 U/L (ref 38–126)
Anion gap: 7 (ref 5–15)
BUN: 26 mg/dL — ABNORMAL HIGH (ref 6–20)
CO2: 29 mmol/L (ref 22–32)
Calcium: 10.2 mg/dL (ref 8.9–10.3)
Chloride: 102 mmol/L (ref 98–111)
Creatinine, Ser: 1.09 mg/dL (ref 0.61–1.24)
GFR calc Af Amer: >60 mL/min (ref >60)
GFR calc non Af Amer: >60 mL/min (ref >60)
Glucose, Bld: 124 mg/dL — ABNORMAL HIGH (ref 70–99)
Potassium: 4.8 mmol/L (ref 3.5–5.1)
Sodium: 138 mmol/L (ref 135–145)
Total Bilirubin: 0.7 mg/dL (ref 0.3–1.2)
Total Protein: 6.2 g/dL — ABNORMAL LOW (ref 6.5–8.1)

## 2019-06-27 LAB — CBC WITH DIFFERENTIAL/PLATELET
Abs Immature Granulocytes: 0.09 10*3/uL — ABNORMAL HIGH (ref 0.00–0.07)
Basophils Absolute: 0 10*3/uL (ref 0.0–0.1)
Basophils Relative: 0 %
Eosinophils Absolute: 0 10*3/uL (ref 0.0–0.5)
Eosinophils Relative: 0 %
HCT: 44.3 % (ref 39.0–52.0)
Hemoglobin: 14 g/dL (ref 13.0–17.0)
Immature Granulocytes: 1 %
Lymphocytes Relative: 5 %
Lymphs Abs: 0.8 10*3/uL (ref 0.7–4.0)
MCH: 27 pg (ref 26.0–34.0)
MCHC: 31.6 g/dL (ref 30.0–36.0)
MCV: 85.5 fL (ref 80.0–100.0)
Monocytes Absolute: 0.3 10*3/uL (ref 0.1–1.0)
Monocytes Relative: 2 %
Neutro Abs: 14.9 10*3/uL — ABNORMAL HIGH (ref 1.7–7.7)
Neutrophils Relative %: 92 %
Platelets: 348 10*3/uL (ref 150–400)
RBC: 5.18 MIL/uL (ref 4.22–5.81)
RDW: 12.8 % (ref 11.5–15.5)
WBC: 16.1 10*3/uL — ABNORMAL HIGH (ref 4.0–10.5)
nRBC: 0 % (ref 0.0–0.2)

## 2019-06-27 LAB — C-REACTIVE PROTEIN: CRP: 6.7 mg/dL — ABNORMAL HIGH (ref <1.0)

## 2019-06-27 LAB — CULTURE, BLOOD (ROUTINE X 2)
Culture: NO GROWTH
Culture: NO GROWTH

## 2019-06-27 LAB — MAGNESIUM: Magnesium: 2.5 mg/dL — ABNORMAL HIGH (ref 1.7–2.4)

## 2019-06-27 LAB — D-DIMER, QUANTITATIVE: D-Dimer, Quant: 0.51 ug/mL-FEU — ABNORMAL HIGH (ref 0.00–0.50)

## 2019-06-27 LAB — FERRITIN: Ferritin: 408 ng/mL — ABNORMAL HIGH (ref 24–336)

## 2019-06-27 LAB — PHOSPHORUS: Phosphorus: 4.2 mg/dL (ref 2.5–4.6)

## 2019-06-27 MED ORDER — ALBUTEROL SULFATE HFA 108 (90 BASE) MCG/ACT IN AERS: 2.0000 | INHALATION_SPRAY | Freq: Four times a day (QID) | RESPIRATORY_TRACT | 0 refills | Status: AC

## 2019-06-27 MED ORDER — DEXAMETHASONE 6 MG PO TABS: 6.0000 mg | ORAL_TABLET | Freq: Every day | ORAL | 0 refills | Status: AC

## 2019-06-27 MED ORDER — ZINC SULFATE 220 (50 ZN) MG PO CAPS: 220.0000 mg | ORAL_CAPSULE | Freq: Every day | ORAL | 0 refills | Status: AC

## 2019-06-27 MED ORDER — GUAIFENESIN-DM 100-10 MG/5ML PO SYRP: 10.0000 mL | ORAL_SOLUTION | ORAL | 0 refills | Status: AC | PRN

## 2019-06-27 MED ORDER — ASCORBIC ACID 500 MG PO TABS: 500.0000 mg | ORAL_TABLET | Freq: Every day | ORAL | 0 refills | Status: AC

## 2019-06-27 MED ORDER — METOPROLOL TARTRATE 25 MG PO TABS: 25.0000 mg | ORAL_TABLET | Freq: Two times a day (BID) | ORAL | 0 refills | Status: DC

## 2019-06-27 NOTE — Progress Notes (Signed)
Patient discharged with home oxygen delivered and portable o2 tank. Wife picked up patient. Prescriptions given. Discharge instructions explained. Oxygen tank education given.

## 2019-06-27 NOTE — Progress Notes (Addendum)
Patient scheduled for outpatient Remdesivir infusion at 530PM on Friday 1/8 .  Please advise them to report to [facility] at [address]  Drive to the security guard and tell them you are here for an infusion. They will direct you to the front entrance where we will come and get you.  For questions call [phone].  Thanks

## 2019-06-27 NOTE — Discharge Instructions (Signed)
 COVID-19 COVID-19 is a respiratory infection that is caused by a virus called severe acute respiratory syndrome coronavirus 2 (SARS-CoV-2). The disease is also known as coronavirus disease or novel coronavirus. In some people, the virus may not cause any symptoms. In others, it may cause a serious infection. The infection can get worse quickly and can lead to complications, such as:  Pneumonia, or infection of the lungs.  Acute respiratory distress syndrome or ARDS. This is a condition in which fluid build-up in the lungs prevents the lungs from filling with air and passing oxygen into the blood.  Acute respiratory failure. This is a condition in which there is not enough oxygen passing from the lungs to the body or when carbon dioxide is not passing from the lungs out of the body.  Sepsis or septic shock. This is a serious bodily reaction to an infection.  Blood clotting problems.  Secondary infections due to bacteria or fungus.  Organ failure. This is when your body's organs stop working. The virus that causes COVID-19 is contagious. This means that it can spread from person to person through droplets from coughs and sneezes (respiratory secretions). What are the causes? This illness is caused by a virus. You may catch the virus by:  Breathing in droplets from an infected person. Droplets can be spread by a person breathing, speaking, singing, coughing, or sneezing.  Touching something, like a table or a doorknob, that was exposed to the virus (contaminated) and then touching your mouth, nose, or eyes. What increases the risk? Risk for infection You are more likely to be infected with this virus if you:  Are within 6 feet (2 meters) of a person with COVID-19.  Provide care for or live with a person who is infected with COVID-19.  Spend time in crowded indoor spaces or live in shared housing. Risk for serious illness You are more likely to become seriously ill from the virus if  you:  Are 50 years of age or older. The higher your age, the more you are at risk for serious illness.  Live in a nursing home or long-term care facility.  Have cancer.  Have a long-term (chronic) disease such as: ? Chronic lung disease, including chronic obstructive pulmonary disease or asthma. ? A long-term disease that lowers your body's ability to fight infection (immunocompromised). ? Heart disease, including heart failure, a condition in which the arteries that lead to the heart become narrow or blocked (coronary artery disease), a disease which makes the heart muscle thick, weak, or stiff (cardiomyopathy). ? Diabetes. ? Chronic kidney disease. ? Sickle cell disease, a condition in which red blood cells have an abnormal "sickle" shape. ? Liver disease.  Are obese. What are the signs or symptoms? Symptoms of this condition can range from mild to severe. Symptoms may appear any time from 2 to 14 days after being exposed to the virus. They include:  A fever or chills.  A cough.  Difficulty breathing.  Headaches, body aches, or muscle aches.  Runny or stuffy (congested) nose.  A sore throat.  New loss of taste or smell. Some people may also have stomach problems, such as nausea, vomiting, or diarrhea. Other people may not have any symptoms of COVID-19. How is this diagnosed? This condition may be diagnosed based on:  Your signs and symptoms, especially if: ? You live in an area with a COVID-19 outbreak. ? You recently traveled to or from an area where the virus is common. ?   You provide care for or live with a person who was diagnosed with COVID-19. ? You were exposed to a person who was diagnosed with COVID-19.  A physical exam.  Lab tests, which may include: ? Taking a sample of fluid from the back of your nose and throat (nasopharyngeal fluid), your nose, or your throat using a swab. ? A sample of mucus from your lungs (sputum). ? Blood tests.  Imaging tests,  which may include, X-rays, CT scan, or ultrasound. How is this treated? At present, there is no medicine to treat COVID-19. Medicines that treat other diseases are being used on a trial basis to see if they are effective against COVID-19. Your health care provider will talk with you about ways to treat your symptoms. For most people, the infection is mild and can be managed at home with rest, fluids, and over-the-counter medicines. Treatment for a serious infection usually takes places in a hospital intensive care unit (ICU). It may include one or more of the following treatments. These treatments are given until your symptoms improve.  Receiving fluids and medicines through an IV.  Supplemental oxygen. Extra oxygen is given through a tube in the nose, a face mask, or a hood.  Positioning you to lie on your stomach (prone position). This makes it easier for oxygen to get into the lungs.  Continuous positive airway pressure (CPAP) or bi-level positive airway pressure (BPAP) machine. This treatment uses mild air pressure to keep the airways open. A tube that is connected to a motor delivers oxygen to the body.  Ventilator. This treatment moves air into and out of the lungs by using a tube that is placed in your windpipe.  Tracheostomy. This is a procedure to create a hole in the neck so that a breathing tube can be inserted.  Extracorporeal membrane oxygenation (ECMO). This procedure gives the lungs a chance to recover by taking over the functions of the heart and lungs. It supplies oxygen to the body and removes carbon dioxide. Follow these instructions at home: Lifestyle  If you are sick, stay home except to get medical care. Your health care provider will tell you how long to stay home. Call your health care provider before you go for medical care.  Rest at home as told by your health care provider.  Do not use any products that contain nicotine or tobacco, such as cigarettes,  e-cigarettes, and chewing tobacco. If you need help quitting, ask your health care provider.  Return to your normal activities as told by your health care provider. Ask your health care provider what activities are safe for you. General instructions  Take over-the-counter and prescription medicines only as told by your health care provider.  Drink enough fluid to keep your urine pale yellow.  Keep all follow-up visits as told by your health care provider. This is important. How is this prevented?  There is no vaccine to help prevent COVID-19 infection. However, there are steps you can take to protect yourself and others from this virus. To protect yourself:   Do not travel to areas where COVID-19 is a risk. The areas where COVID-19 is reported change often. To identify high-risk areas and travel restrictions, check the CDC travel website: wwwnc.cdc.gov/travel/notices  If you live in, or must travel to, an area where COVID-19 is a risk, take precautions to avoid infection. ? Stay away from people who are sick. ? Wash your hands often with soap and water for 20 seconds. If soap and   water are not available, use an alcohol-based hand sanitizer. ? Avoid touching your mouth, face, eyes, or nose. ? Avoid going out in public, follow guidance from your state and local health authorities. ? If you must go out in public, wear a cloth face covering or face mask. Make sure your mask covers your nose and mouth. ? Avoid crowded indoor spaces. Stay at least 6 feet (2 meters) away from others. ? Disinfect objects and surfaces that are frequently touched every day. This may include:  Counters and tables.  Doorknobs and light switches.  Sinks and faucets.  Electronics, such as phones, remote controls, keyboards, computers, and tablets. To protect others: If you have symptoms of COVID-19, take steps to prevent the virus from spreading to others.  If you think you have a COVID-19 infection, contact  your health care provider right away. Tell your health care team that you think you may have a COVID-19 infection.  Stay home. Leave your house only to seek medical care. Do not use public transport.  Do not travel while you are sick.  Wash your hands often with soap and water for 20 seconds. If soap and water are not available, use alcohol-based hand sanitizer.  Stay away from other members of your household. Let healthy household members care for children and pets, if possible. If you have to care for children or pets, wash your hands often and wear a mask. If possible, stay in your own room, separate from others. Use a different bathroom.  Make sure that all people in your household wash their hands well and often.  Cough or sneeze into a tissue or your sleeve or elbow. Do not cough or sneeze into your hand or into the air.  Wear a cloth face covering or face mask. Make sure your mask covers your nose and mouth. Where to find more information  Centers for Disease Control and Prevention: www.cdc.gov/coronavirus/2019-ncov/index.html  World Health Organization: www.who.int/health-topics/coronavirus Contact a health care provider if:  You live in or have traveled to an area where COVID-19 is a risk and you have symptoms of the infection.  You have had contact with someone who has COVID-19 and you have symptoms of the infection. Get help right away if:  You have trouble breathing.  You have pain or pressure in your chest.  You have confusion.  You have bluish lips and fingernails.  You have difficulty waking from sleep.  You have symptoms that get worse. These symptoms may represent a serious problem that is an emergency. Do not wait to see if the symptoms will go away. Get medical help right away. Call your local emergency services (911 in the [place] Do not drive yourself to the hospital. Let the emergency medical personnel know if you think you have  COVID-19. Summary  COVID-19 is a respiratory infection that is caused by a virus. It is also known as coronavirus disease or novel coronavirus. It can cause serious infections, such as pneumonia, acute respiratory distress syndrome, acute respiratory failure, or sepsis.  The virus that causes COVID-19 is contagious. This means that it can spread from person to person through droplets from breathing, speaking, singing, coughing, or sneezing.  You are more likely to develop a serious illness if you are 50 years of age or older, have a weak immune system, live in a nursing home, or have chronic disease.  There is no medicine to treat COVID-19. Your health care provider will talk with you about ways to treat your   symptoms.  Take steps to protect yourself and others from infection. Wash your hands often and disinfect objects and surfaces that are frequently touched every day. Stay away from people who are sick and wear a mask if you are sick. This information is not intended to replace advice given to you by your health care provider. Make sure you discuss any questions you have with your health care provider. Document Revised: 04/05/2019 Document Reviewed: 07/12/2018 Elsevier Patient Education  2020 [employer]. You are scheduled for an outpatient infusion of Remdesivir at 530PM on Friday 1/8.  Please report to [facility] at [address].  Drive to the security guard and tell them you are here for an infusion. They will direct you to the front entrance where we will come and get you.  For questions call [phone].  Thanks

## 2019-06-27 NOTE — Discharge Summary (Signed)
Physician Discharge Summary  Louis Kennedy ZOX:[id number] DOB: 11/13/1960 DOA: 06/24/2019  PCP: Patient, No Pcp Per  Admit date: 06/24/2019 Discharge date: 06/27/2019  Time spent: 35 minutes  Recommendations for Outpatient Follow-up:  Covid pneumonia/acute respiratory failure with hypoxia COVID-19 Labs  Recent Labs    06/25/19 0519 06/26/19 0130 06/27/19 0015  DDIMER 0.81* 0.55* 0.51*  FERRITIN 837* 762* 408*  CRP 24.0* 12.0* 6.7*    Lab Results  Component Value Date   SARSCOV2NAA POSITIVE (A) 06/24/2019    -Decadron p.o. 6 mg daily -Remdesivir per pharmacy protocol -1/4 Actemra x1 dose SATURATION QUALIFICATIONS: (Thisnote is usedto comply with regulatory documentation for home oxygen) Patient Saturations on Room Air at Rest =99% Patient Saturations on [employer] Air while Ambulating =78-99% Patient Saturations on2-4Liters of oxygen while Ambulating = 90-100% Please briefly explain why patient needs home oxygen: Upon entering room, pt on RA. Saturating in high 90's. Saturations WNL until we left pt room. Sats went as low as 78%on RA with ambulation. Pt placed on 2L. Sats went from 96 to 85%. Pt placed on 3L. Sats went from 94-87%. Pt then placed on 4L. Sats stayed within high 90's to 100. Pt then decreased down to 3L. Sats stayed WNL until back in room. Pt placed back on RA and sats at 96% at rest after ambulation -2 L O2 titrate O2 to maintain SPO2> 88% -Provide Inogen portable home O2 concentrator  -outpatient Remdesivir infusion at 530PM on Friday 1/8 .  Please advise them to [facility] at [address]  Drive to the security guard and tell them you are here for an infusion. They will direct you to the front entrance where we will come and get you.  For questions call [phone].  Thanks     Essential hypertension with tachycardia -Lopressor 25 mg BID  Hyperlipidemia -LFTs still slightly elevated PCP to start statins when  normalized.  Elevated LFT's:l -Remains slightly elevated but improving   Hypercalcemia:  -Improving      Discharge Diagnoses:  Principal Problem:   Acute respiratory disease due to COVID-19 virus   Discharge Condition: Stable  Diet recommendation: Heart healthy  Filed Weights   06/24/19 2205  Weight: 70.2 kg    History of present illness:  59 year old BM PMHx hypertension, hyperlipidemiapresented to ED with COVID-19. 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 He had taken full precautions however 2 weeks ago he had to go to the ER for muscle spasm and pain in his legs. He thinks that is how he got the Covid. 10 days ago he started having productive coughing, generalized weakness, nausea and diarrhea. Diarrhea is currently improving. 6 days ago he had Covid test done which was [place] in [place]. [facility] [place] [facility].Patient reported that his symptoms have been worsening, he initially could walk to his bathroom however now he he feels short of breath with minimal exertion. He was started on Zithromax outpatient. Also reported fevers and rigors at home.  Hospital Course:  During his hospitalization patient was treated for Covid pneumonia per protocol responded well.  Patient will finish his remdesivir protocol at the remdesivir outpatient clinic.  Appointment times have been arranged.  Ventilator: Nasal cannula 1/7 Flow; 2 L/min SPO2 96%   Antibiotics  06/25/19 1000  remdesivir 100 mg in sodium chloride 0.9 % 100 mL IVPB     100 mg 200 mL/hr  over 30 Minutes 06/29/19 0959   06/24/19 1000  levofloxacin (LEVAQUIN) tablet 750 mg  Status:  Discontinued     750 mg 06/25/19 1811   06/24/19 0900  remdesivir 200 mg in sodium chloride 0.9% 250 mL IVPB     200 mg 580 mL/hr over 30 Minutes 06/24/19 1028   06/24/19 0826  remdesivir 200 mg in sodium chloride 0.9% 250 mL IVPB   Status:  Discontinued     200 mg 580 mL/hr over 30 Minutes 06/24/19 0827       Discharge Exam: Vitals:   06/26/19 1544 06/26/19 1927 06/27/19 0520 06/27/19 0735  BP: 116/81 112/77  121/73  Pulse:  93  80  Resp:  19  17  Temp: 98.1 F (36.7 C) 99.3 F (37.4 C) 99 F (37.2 C) 98.1 F (36.7 C)  TempSrc: Oral Oral Oral Oral  SpO2:  93%  98%  Weight:      Height:        General: A/O x4, positive acute respiratory distress Eyes: negative scleral hemorrhage, negative anisocoria, negative icterus ENT: Negative Runny nose, negative gingival bleeding, Neck:  Negative scars, masses, torticollis, lymphadenopathy, JVD Lungs: Decreased breath sounds bilaterally without wheezes or crackles Cardiovascular: Regular rate and rhythm without murmur gallop or rub normal S1 and S2  Discharge Instructions   Allergies as of 06/27/2019      Reactions   Penicillins Other (See Comments)   Dizziness Did it involve swelling of the face/tongue/throat, SOB, or low BP? No Did it involve sudden or severe rash/hives, skin peeling, or any reaction on the inside of your mouth or nose? No  Did you need to seek medical attention at a hospital or doctor's office? No When did it last happen?Several years ago If all above answers are "NO", may proceed with cephalosporin use.      Medication List    STOP taking these medications   amLODipine 5 MG tablet Commonly known as: NORVASC   atorvastatin 10 MG tablet Commonly known as: LIPITOR   azithromycin 250 MG tablet Commonly known as: ZITHROMAX     TAKE these medications   acetaminophen 500 MG tablet Commonly known as: TYLENOL Take 1,000 mg by mouth every 6 (six) hours as needed for fever.   albuterol 108 (90 Base) MCG/ACT inhaler Commonly known as: VENTOLIN HFA Inhale 2 puffs into the lungs every 6 (six) hours.   ascorbic acid 500 MG tablet Commonly known as: VITAMIN C Take 1 tablet (500 mg total) by mouth daily. Start taking on: June 28, 2019   dexamethasone 6 MG tablet Commonly known as: DECADRON Take 1 tablet (6 mg total) by mouth daily. Start taking on: June 28, 2019   guaiFENesin-dextromethorphan 100-10 MG/5ML syrup Commonly known as: ROBITUSSIN DM Take 10 mLs by mouth every 4 (four) hours as needed for cough.   ibuprofen 800 MG tablet Commonly known as: ADVIL Take 800 mg by mouth every 8 (eight) hours as needed.   metoprolol tartrate 25 MG tablet Commonly known as: LOPRESSOR Take 1 tablet (25 mg total) by mouth 2 (two) times daily.   zinc sulfate 220 (50 Zn) MG capsule Take 1 capsule (220 mg total) by mouth daily. Start taking on: June 28, 2019            Durable Medical Equipment  (From admission, onward)         Start     Ordered   06/26/19 1703  For home use only DME oxygen  Once  Comments: SATURATION QUALIFICATIONS: (This note is used to comply with regulatory documentation for home oxygen) Patient Saturations on Room Air at Rest = 99% Patient Saturations on Room Air while Ambulating = 78-99% Patient Saturations on 2-4 Liters of oxygen while Ambulating = 90-100% Please briefly explain why patient needs home oxygen:  Upon entering room, pt on RA. Saturating in high 90's. Saturations WNL until we left pt room. Sats went as low as 78% on RA with ambulation. Pt placed on 2L. Sats went from 96 to 85%. Pt placed on 3L. Sats went from 94-87%. Pt then placed on 4L. Sats stayed within high 90's to 100. Pt then decreased down to 3L. Sats stayed WNL until back in room. Pt placed back on RA and sats at 96% at rest after ambulation -2 L O2 titrate O2 to maintain SPO2> 88% -Provide Inogen portable home O2 concentrator  Question Answer Comment  Length of Need 12 Months   Mode or (Route) Nasal cannula   Liters per Minute 2   Frequency Continuous (stationary and portable oxygen unit needed)   Oxygen conserving device Yes   Oxygen delivery system Gas      06/26/19 1702         Allergies   Allergen Reactions  . Penicillins Other (See Comments)    Dizziness  Did it involve swelling of the face/tongue/throat, SOB, or low BP? No Did it involve sudden or severe rash/hives, skin peeling, or any reaction on the inside of your mouth or nose? No  Did you need to seek medical attention at a hospital or doctor's office? No When did it last happen?Several years ago If all above answers are "NO", may proceed with cephalosporin use.       The results of significant diagnostics from this hospitalization (including imaging, microbiology, ancillary and laboratory) are listed below for reference.    Significant Diagnostic Studies: CT Angio Chest PE W and/or Wo Contrast  Result Date: 06/22/2019 CLINICAL DATA:  Shortness of breath, elevated D-dimer level. EXAM: CT ANGIOGRAPHY CHEST WITH CONTRAST TECHNIQUE: Multidetector CT imaging of the chest was performed using the standard protocol during bolus administration of intravenous contrast. Multiplanar CT image reconstructions and MIPs were obtained to evaluate the vascular anatomy. CONTRAST:  100mL OMNIPAQUE IOHEXOL 350 MG/ML SOLN COMPARISON:  None. FINDINGS: Cardiovascular: Satisfactory opacification of the pulmonary arteries to the segmental level. No evidence of pulmonary embolism. Normal heart size. No pericardial effusion. Mediastinum/Nodes: No enlarged mediastinal, hilar, or axillary lymph nodes. Thyroid gland, trachea, and esophagus demonstrate no significant findings. Lungs/Pleura: No pneumothorax or pleural effusion is noted. Multiple ground-glass opacities are noted throughout both lungs consistent with multifocal pneumonia, potentially of viral etiology. Upper Abdomen: No acute abnormality. Musculoskeletal: No chest wall abnormality. No acute or significant osseous findings. Review of the MIP images confirms the above findings. IMPRESSION: 1. No definite evidence of pulmonary embolus. 2. Multiple ground-glass opacities are noted  throughout both lungs consistent with multifocal pneumonia, potentially of viral etiology. Electronically Signed   By: Lupita RaiderJames  Green Jr M.D.   On: 06/22/2019 09:55   DG Chest Port 1 View  Result Date: 06/24/2019 CLINICAL DATA:  Hypoxia. Shortness of breath.  COVID-19 positive. EXAM: PORTABLE CHEST 1 VIEW COMPARISON:  Radiographs and CTA 06/22/2019 FINDINGS: Persistent low lung volumes. Worsening bilateral airspace disease primarily at the left lung base. The heart is normal in size. Normal mediastinal contours. No pneumothorax or large pleural effusion. IMPRESSION: Worsening bilateral airspace disease consistent with COVID pneumonia. Electronically Signed  By: Narda Rutherford M.D.   On: 06/24/2019 05:57   DG Chest Port 1 View  Result Date: 06/22/2019 CLINICAL DATA:  COVID-19 positive. EXAM: PORTABLE CHEST 1 VIEW COMPARISON:  None. FINDINGS: Confluent airspace disease at the right lung base, with additional vague patchy opacities in the right mid lung and left lower lung zone. The heart is normal in size. No pulmonary edema, pleural effusion, or pneumothorax. No acute osseous abnormalities are seen. IMPRESSION: Vague patchy opacities in both lungs, pattern consistent with COVID-19 pneumonia. More confluent right lower lobe opacity may represent COVID or superimposed bacterial infection. Electronically Signed   By: Narda Rutherford M.D.   On: 06/22/2019 06:20    Microbiology: Recent Results (from the past 240 hour(s))  Blood Culture (routine x 2)     Status: None (Preliminary result)   Collection Time: 06/22/19  6:48 AM   Specimen: BLOOD  Result Value Ref Range Status   Specimen Description   Final    BLOOD LEFT ANTECUBITAL Performed at [facility], [address] [place], [place] [address]    Special Requests   Final    BOTTLES DRAWN AEROBIC AND ANAEROBIC Blood Culture results may not be optimal due to an excessive volume of blood received in culture bottles Performed at [facility], [address] [place], [place] [address]    Culture   Final    NO GROWTH 4 DAYS Performed at [facility], [address], [place], [place] [address]    Report Status PENDING  Incomplete  Blood Culture (routine x 2)     Status: None (Preliminary result)   Collection Time: 06/22/19  7:00 AM   Specimen: BLOOD  Result Value Ref Range Status   Specimen Description   Final    BLOOD LEFT ANTECUBITAL Performed at [facility], [address] [place], [place] [address]    Special Requests   Final    BOTTLES DRAWN AEROBIC AND ANAEROBIC Blood Culture results may not be optimal due to an excessive volume of blood received in culture bottles Performed at [facility], [address] [place], [place] [address]    Culture   Final    NO GROWTH 4 DAYS Performed at [facility], [address], [place], [place] [address]    Report Status PENDING  Incomplete  Blood Culture (routine x 2)     Status: None (Preliminary result)   Collection Time: 06/24/19  5:40 AM   Specimen: BLOOD  Result Value Ref Range Status   Specimen Description   Final    BLOOD LEFT ARM Performed at [facility], [address], [place], [place] [address]    Special Requests   Final    BOTTLES DRAWN AEROBIC AND ANAEROBIC Blood Culture adequate volume Performed at [facility], [address] [place], [place] [address]    Culture   Final    NO GROWTH 2 DAYS Performed at [facility], [address], [place], [place] [address]    Report Status PENDING  Incomplete  Blood Culture (routine x 2)     Status: None (Preliminary result)   Collection Time: 06/24/19  5:45 AM   Specimen: BLOOD  Result Value Ref Range Status   Specimen Description   Final    BLOOD RIGHT ANTECUBITAL Performed at [facility], [address] [place], [place] [address]    Special  Requests   Final    BOTTLES DRAWN AEROBIC AND  ANAEROBIC Blood Culture adequate volume Performed at [facility] [address], [place], [place] [address]    Culture   Final    NO GROWTH 2 DAYS Performed at [facility] [address] [place], [place] [address]    Report Status PENDING  Incomplete  SARS CORONAVIRUS 2 (TAT 6-24 HRS) Nasopharyngeal Nasopharyngeal Swab     Status: Abnormal   Collection Time: 06/24/19  7:38 AM   Specimen: Nasopharyngeal Swab  Result Value Ref Range Status   SARS Coronavirus 2 POSITIVE (A) NEGATIVE Final    Comment: RESULT CALLED TO, READ BACK BY AND VERIFIED WITH: K.ZULETA RN [id number] 06/24/19 MCCORMICK K (NOTE) SARS-CoV-2 target nucleic acids are DETECTED. The SARS-CoV-2 RNA is generally detectable in upper and lower respiratory specimens during the acute phase of infection. Positive results are indicative of the presence of SARS-CoV-2 RNA. Clinical correlation with patient history and other diagnostic information is  necessary to determine patient infection status. Positive results do not rule out bacterial infection or co-infection with other viruses.  The expected result is Negative. Fact Sheet for Patients: SugarRoll.be Fact Sheet for Healthcare Providers: https://www.Harlea Goetzinger-mathews.com/ This test is not yet approved or cleared by the [place] FDA and  has been authorized for detection and/or diagnosis of SARS-CoV-2 by FDA under an Emergency Use Authorization (EUA). This EUA will remain  in effect (meaning this test can be used) for t he duration of the COVID-19 declaration under Section 564(b)(1) of the Act, 21 U.S.C. section 360bbb-3(b)(1), unless the authorization is terminated or revoked sooner. Performed at [facility], [place] [address] [place], [place] [address]      Labs: Basic Metabolic Panel: Recent Labs  Lab 06/22/19 [phone] 06/24/19 0542 06/24/19 0931 06/25/19 0519 06/26/19 0130 06/27/19 0015  NA  135 135  --  140 139 138  K 3.5 3.9  --  4.2 4.4 4.8  CL 98 99  --  100 100 102  CO2 27 27  --  27 28 29   GLUCOSE 109* 151*  --  106* 116* 124*  BUN 11 9  --  17 24* 26*  CREATININE 0.97 1.02 0.91 1.06 0.98 1.09  CALCIUM 9.7 10.0  --  10.5* 10.2 10.2  MG  --   --   --   --  2.2 2.5*  PHOS  --   --   --   --  3.9 4.2   Liver Function Tests: Recent Labs  Lab 06/22/19 0651 06/24/19 0931 06/25/19 0519 06/26/19 0130 06/27/19 0015  AST 44* 116* 64* 44* 43*  ALT 72* 191* 147* 112* 113*  ALKPHOS 63 92 97 91 89  BILITOT 0.9 0.9 0.5 0.8 0.7  PROT 6.8 6.7 6.7 6.3* 6.2*  ALBUMIN 3.6 3.0* 3.1* 2.7* 2.8*   No results for input(s): LIPASE, AMYLASE in the last 168 hours. No results for input(s): AMMONIA in the last 168 hours. CBC: Recent Labs  Lab 06/22/19 0651 06/24/19 0542 06/25/19 0519 06/26/19 0130 06/27/19 0015  WBC 10.9* 16.9* 19.3* 16.0* 16.1*  NEUTROABS 9.6* 15.0* 16.9* 14.8* 14.9*  HGB 14.8 13.9 14.4 13.7 14.0  HCT 46.3 43.4 45.6 42.2 44.3  MCV 85.1 84.1 84.4 84.1 85.5  PLT 146* 204 261 307 348   Cardiac Enzymes: No results for input(s): CKTOTAL, CKMB, CKMBINDEX, TROPONINI in the last 168 hours. BNP: BNP (last 3 results) Recent Labs    06/24/19 0542  BNP 42.8    ProBNP (last 3 results) No results for input(s): PROBNP in the last 8760 hours.  CBG: No results for input(s): GLUCAP in the last 168 hours.     Signed:  Carolyne Littles, MD [facility] [phone] pager

## 2019-06-27 NOTE — TOC Transition Note (Signed)
Transition of Care [facility]) - CM/SW Discharge Note   Patient Details  Name: Louis Kennedy MRN: [id number] Date of Birth: 1960/09/02  [facility] CM/SW Contact:  Elliot Gault, LCSW Phone Number: 06/27/2019, 4:02 PM   Clinical Narrative:     Pt stable for dc and needs home O2. O2 arranged with [employer]. Home concentrator is at the home and [facility] had brought portable tank to the room for pt to dc.   Pt's wife states she can come get him whenever we want. Updated pt's RN.  There are no other TOC needs for dc.  Final next level of care: Home/Self Care Barriers to Discharge: Barriers Resolved   Patient Goals and CMS Choice        Discharge Placement                       Discharge Plan and Services                DME Arranged: Oxygen DME Agency: [employer]                  Social Determinants of Health (SDOH) Interventions     Readmission Risk Interventions No flowsheet data found.

## 2019-06-27 NOTE — Progress Notes (Signed)
Ambulation Note  Saturation Pre: 96% on RA  Ambulation Distance: 260 ft  Saturation During Ambulation: 91% on 3L  Notes: Started on 2L and desaturated to 81%. Pt quickly recovered on 3L to >90%. Pt returned to recliner with call bell and phone within reach.   Alisia Ferrari, MS, ACSM CEP 1:56 PM 06/27/2019

## 2019-06-28 ENCOUNTER — Ambulatory Visit (HOSPITAL_COMMUNITY)
Admission: RE | Admit: 2019-06-28 | Discharge: 2019-06-28 | Disposition: A | Discharge status: Home / Self Care | Payer: BC Managed Care – PPO | Source: Ambulatory Visit | Attending: Pulmonary Disease | Admitting: Pulmonary Disease

## 2019-06-28 VITALS — BP 117/74 | Temp 98.1°F | Resp 20

## 2019-06-28 DIAGNOSIS — J069 Acute upper respiratory infection, unspecified: Secondary | ICD-10-CM | POA: Insufficient documentation

## 2019-06-28 DIAGNOSIS — U071 COVID-19: Secondary | ICD-10-CM | POA: Diagnosis not present

## 2019-06-28 MED ORDER — EPINEPHRINE 0.3 MG/0.3ML IJ SOAJ: 0.3000 mg | Freq: Once | INTRAMUSCULAR | Status: DC | PRN

## 2019-06-28 MED ORDER — ALBUTEROL SULFATE HFA 108 (90 BASE) MCG/ACT IN AERS: 2.0000 | INHALATION_SPRAY | Freq: Once | RESPIRATORY_TRACT | Status: DC | PRN

## 2019-06-28 MED ORDER — SODIUM CHLORIDE 0.9 % IV SOLN
100.0000 mg | Freq: Once | INTRAVENOUS | Status: AC
  Administered 2019-06-28: 100 mg via INTRAVENOUS

## 2019-06-28 MED ORDER — METHYLPREDNISOLONE SODIUM SUCC 125 MG IJ SOLR: 125.0000 mg | Freq: Once | INTRAMUSCULAR | Status: DC | PRN

## 2019-06-28 MED ORDER — DIPHENHYDRAMINE HCL 50 MG/ML IJ SOLN: 50.0000 mg | Freq: Once | INTRAMUSCULAR | Status: DC | PRN

## 2019-06-28 MED ORDER — FAMOTIDINE IN NACL 20-0.9 MG/50ML-% IV SOLN: 20.0000 mg | Freq: Once | INTRAVENOUS | Status: DC | PRN

## 2019-06-28 MED ORDER — SODIUM CHLORIDE 0.9 % IV SOLN: INTRAVENOUS | Status: DC | PRN

## 2019-06-28 MED ORDER — SODIUM CHLORIDE 0.9 % IV SOLN
INTRAVENOUS | Status: AC
  Filled 2019-06-28: qty 20

## 2019-06-28 NOTE — Discharge Instructions (Signed)
10 Things You Can Do to Manage Your COVID-19 Symptoms at Home If you have possible or confirmed COVID-19: 1. Stay home from work and school. And stay away from other public places. If you must go out, avoid using any kind of public transportation, ridesharing, or taxis. 2. Monitor your symptoms carefully. If your symptoms get worse, call your healthcare provider immediately. 3. Get rest and stay hydrated. 4. If you have a medical appointment, call the healthcare provider ahead of time and tell them that you have or may have COVID-19. 5. For medical emergencies, call 911 and notify the dispatch personnel that you have or may have COVID-19. 6. Cover your cough and sneezes with a tissue or use the inside of your elbow. 7. Wash your hands often with soap and water for at least 20 seconds or clean your hands with an alcohol-based hand sanitizer that contains at least 60% alcohol. 8. As much as possible, stay in a specific room and away from other people in your home. Also, you should use a separate bathroom, if available. If you need to be around other people in or outside of the home, wear a mask. 9. Avoid sharing personal items with other people in your household, like dishes, towels, and bedding. 10. Clean all surfaces that are touched often, like counters, tabletops, and doorknobs. Use household cleaning sprays or wipes according to the label instructions. cdc.gov/coronavirus 12/19/2018 This information is not intended to replace advice given to you by your health care provider. Make sure you discuss any questions you have with your health care provider. Document Revised: 05/23/2019 Document Reviewed: 05/23/2019 Elsevier Patient Education  2020 Elsevier Inc.  

## 2019-06-28 NOTE — Progress Notes (Signed)
Patient ID: Louis Kennedy, male   DOB: 1960-09-20, 59 y.o.   MRN: [id number]   Diagnosis: COVID-19  Physician:dr wright  Procedure: Covid Infusion Clinic Med: remdesivir infusion.  Complications: No immediate complications noted.  Discharge: Discharged home   Shaune Spittle 06/28/2019

## 2019-06-29 ENCOUNTER — Ambulatory Visit (HOSPITAL_COMMUNITY): Payer: BC Managed Care – PPO

## 2019-06-29 LAB — CULTURE, BLOOD (ROUTINE X 2)
Culture: NO GROWTH
Culture: NO GROWTH
Special Requests: ADEQUATE
Special Requests: ADEQUATE

## 2019-07-05 DIAGNOSIS — E7800 Pure hypercholesterolemia, unspecified: Secondary | ICD-10-CM | POA: Diagnosis not present

## 2019-07-05 DIAGNOSIS — U071 COVID-19: Secondary | ICD-10-CM | POA: Diagnosis not present

## 2019-07-05 DIAGNOSIS — I10 Essential (primary) hypertension: Secondary | ICD-10-CM | POA: Diagnosis not present

## 2019-07-05 DIAGNOSIS — R5383 Other fatigue: Secondary | ICD-10-CM | POA: Diagnosis not present

## 2019-07-06 DIAGNOSIS — R1013 Epigastric pain: Secondary | ICD-10-CM | POA: Diagnosis not present

## 2019-07-10 DIAGNOSIS — R5383 Other fatigue: Secondary | ICD-10-CM | POA: Diagnosis not present

## 2019-07-10 DIAGNOSIS — U071 COVID-19: Secondary | ICD-10-CM | POA: Diagnosis not present

## 2019-07-10 DIAGNOSIS — E7800 Pure hypercholesterolemia, unspecified: Secondary | ICD-10-CM | POA: Diagnosis not present

## 2019-07-10 DIAGNOSIS — I10 Essential (primary) hypertension: Secondary | ICD-10-CM | POA: Diagnosis not present

## 2019-07-11 DIAGNOSIS — R972 Elevated prostate specific antigen [PSA]: Secondary | ICD-10-CM | POA: Diagnosis not present

## 2019-07-11 DIAGNOSIS — R3912 Poor urinary stream: Secondary | ICD-10-CM | POA: Diagnosis not present

## 2019-07-15 DIAGNOSIS — R748 Abnormal levels of other serum enzymes: Secondary | ICD-10-CM | POA: Diagnosis not present

## 2019-07-15 DIAGNOSIS — E7800 Pure hypercholesterolemia, unspecified: Secondary | ICD-10-CM | POA: Diagnosis not present

## 2019-09-09 DIAGNOSIS — R972 Elevated prostate specific antigen [PSA]: Secondary | ICD-10-CM | POA: Diagnosis not present

## 2019-09-09 DIAGNOSIS — R748 Abnormal levels of other serum enzymes: Secondary | ICD-10-CM | POA: Diagnosis not present

## 2019-09-09 DIAGNOSIS — R350 Frequency of micturition: Secondary | ICD-10-CM | POA: Diagnosis not present

## 2019-12-13 DIAGNOSIS — R972 Elevated prostate specific antigen [PSA]: Secondary | ICD-10-CM | POA: Diagnosis not present

## 2020-03-30 DIAGNOSIS — E7800 Pure hypercholesterolemia, unspecified: Secondary | ICD-10-CM | POA: Diagnosis not present

## 2020-03-30 DIAGNOSIS — Z23 Encounter for immunization: Secondary | ICD-10-CM | POA: Diagnosis not present

## 2020-03-30 DIAGNOSIS — Z0000 Encounter for general adult medical examination without abnormal findings: Secondary | ICD-10-CM | POA: Diagnosis not present

## 2020-03-30 DIAGNOSIS — I10 Essential (primary) hypertension: Secondary | ICD-10-CM | POA: Diagnosis not present

## 2020-08-26 DIAGNOSIS — N401 Enlarged prostate with lower urinary tract symptoms: Secondary | ICD-10-CM | POA: Diagnosis not present

## 2020-08-26 DIAGNOSIS — R3912 Poor urinary stream: Secondary | ICD-10-CM | POA: Diagnosis not present

## 2020-09-14 DIAGNOSIS — R972 Elevated prostate specific antigen [PSA]: Secondary | ICD-10-CM | POA: Diagnosis not present

## 2020-11-08 ENCOUNTER — Encounter (HOSPITAL_BASED_OUTPATIENT_CLINIC_OR_DEPARTMENT_OTHER): Payer: Self-pay | Admitting: Emergency Medicine

## 2020-11-08 ENCOUNTER — Other Ambulatory Visit: Payer: Self-pay

## 2020-11-08 ENCOUNTER — Emergency Department (HOSPITAL_BASED_OUTPATIENT_CLINIC_OR_DEPARTMENT_OTHER)
Admission: EM | Admit: 2020-11-08 | Discharge: 2020-11-08 | Disposition: A | Discharge status: Home / Self Care | Payer: BC Managed Care – PPO | Attending: Emergency Medicine | Admitting: Emergency Medicine

## 2020-11-08 DIAGNOSIS — B349 Viral infection, unspecified: Secondary | ICD-10-CM | POA: Diagnosis not present

## 2020-11-08 DIAGNOSIS — R5383 Other fatigue: Secondary | ICD-10-CM | POA: Insufficient documentation

## 2020-11-08 DIAGNOSIS — R509 Fever, unspecified: Secondary | ICD-10-CM | POA: Diagnosis not present

## 2020-11-08 DIAGNOSIS — Z20822 Contact with and (suspected) exposure to covid-19: Secondary | ICD-10-CM | POA: Diagnosis not present

## 2020-11-08 DIAGNOSIS — M7918 Myalgia, other site: Secondary | ICD-10-CM | POA: Diagnosis not present

## 2020-11-08 DIAGNOSIS — R519 Headache, unspecified: Secondary | ICD-10-CM | POA: Insufficient documentation

## 2020-11-08 LAB — RESP PANEL BY RT-PCR (FLU A&B, COVID) ARPGX2
Influenza A by PCR: NEGATIVE
Influenza B by PCR: NEGATIVE
SARS Coronavirus 2 by RT PCR: NEGATIVE

## 2020-11-08 MED ORDER — ACETAMINOPHEN 500 MG PO TABS
1000.0000 mg | ORAL_TABLET | Freq: Once | ORAL | Status: AC
  Administered 2020-11-08: 1000 mg via ORAL
  Filled 2020-11-08: qty 2

## 2020-11-08 NOTE — ED Triage Notes (Signed)
Reports body aches and fever that started yesterday.

## 2020-11-08 NOTE — Discharge Instructions (Addendum)
Your COVID test is pending; you should expect results within 2-4 hours. You can access your results on your MyChart--if you test positive you should receive a phone call.  In the meantime follow CDC guidelines and quarantine, wear a mask, wash hands often.   Please take over the counter vitamin D 2000-4000 units per day. I also recommend zinc 50 mg per day for the next two weeks.   Please return to ED if you feel have difficulty breathing or have emergent, new or concerning symptoms.  Patients who have symptoms consistent with COVID-19 should self isolated for: At least 3 days (72 hours) have passed since recovery, defined as resolution of fever without the use of fever reducing medications and improvement in respiratory symptoms (e.g., cough, shortness of breath), and At least 7 days have passed since symptoms first appeared.       Person Under Monitoring Name: Louis Kennedy  Location: [address] [place] [address]   Infection Prevention Recommendations for Individuals Confirmed to have, or Being Evaluated for, 2019 Novel Coronavirus (COVID-19) Infection Who Receive Care at Home  Individuals who are confirmed to have, or are being evaluated for, COVID-19 should follow the prevention steps below until a healthcare provider or local or state health department says they can return to normal activities.  Stay home except to get medical care You should restrict activities outside your home, except for getting medical care. Do not go to work, school, or public areas, and do not use public transportation or taxis.  Call ahead before visiting your doctor Before your medical appointment, call the healthcare provider and tell them that you have, or are being evaluated for, COVID-19 infection. This will help the healthcare provider's office take steps to keep other people from getting infected. Ask your healthcare provider to call the local or state health  department.  Monitor your symptoms Seek prompt medical attention if your illness is worsening (e.g., difficulty breathing). Before going to your medical appointment, call the healthcare provider and tell them that you have, or are being evaluated for, COVID-19 infection. Ask your healthcare provider to call the local or state health department.  Wear a facemask You should wear a facemask that covers your nose and mouth when you are in the same room with other people and when you visit a healthcare provider. People who live with or visit you should also wear a facemask while they are in the same room with you.  Separate yourself from other people in your home As much as possible, you should stay in a different room from other people in your home. Also, you should use a separate bathroom, if available.  Avoid sharing household items You should not share dishes, drinking glasses, cups, eating utensils, towels, bedding, or other items with other people in your home. After using these items, you should wash them thoroughly with soap and water.  Cover your coughs and sneezes Cover your mouth and nose with a tissue when you cough or sneeze, or you can cough or sneeze into your sleeve. Throw used tissues in a lined trash can, and immediately wash your hands with soap and water for at least 20 seconds or use an alcohol-based hand rub.  Wash your [employer] your hands often and thoroughly with soap and water for at least 20 seconds. You can use an alcohol-based hand sanitizer if soap and water are not available and if your hands are not visibly dirty. Avoid touching your eyes, nose,  and mouth with unwashed hands.   Prevention Steps for Caregivers and Household Members of Individuals Confirmed to have, or Being Evaluated for, COVID-19 Infection Being Cared for in the Home  If you live with, or provide care at home for, a person confirmed to have, or being evaluated for, COVID-19  infection please follow these guidelines to prevent infection:  Follow healthcare provider's instructions Make sure that you understand and can help the patient follow any healthcare provider instructions for all care.  Provide for the patient's basic needs You should help the patient with basic needs in the home and provide support for getting groceries, prescriptions, and other personal needs.  Monitor the patient's symptoms If they are getting sicker, call his or her medical provider and tell them that the patient has, or is being evaluated for, COVID-19 infection. This will help the healthcare provider's office take steps to keep other people from getting infected. Ask the healthcare provider to call the local or state health department.  Limit the number of people who have contact with the patient If possible, have only one caregiver for the patient. Other household members should stay in another home or place of residence. If this is not possible, they should stay in another room, or be separated from the patient as much as possible. Use a separate bathroom, if available. Restrict visitors who do not have an essential need to be in the home.  Keep older adults, very young children, and other sick people away from the patient Keep older adults, very young children, and those who have compromised immune systems or chronic health conditions away from the patient. This includes people with chronic heart, lung, or kidney conditions, diabetes, and cancer.  Ensure good ventilation Make sure that shared spaces in the home have good air flow, such as from an air conditioner or an opened window, weather permitting.  Wash your hands often Wash your hands often and thoroughly with soap and water for at least 20 seconds. You can use an alcohol based hand sanitizer if soap and water are not available and if your hands are not visibly dirty. Avoid touching your eyes, nose, and mouth with  unwashed hands. Use disposable paper towels to dry your hands. If not available, use dedicated cloth towels and replace them when they become wet.  Wear a facemask and gloves Wear a disposable facemask at all times in the room and gloves when you touch or have contact with the patient's blood, body fluids, and/or secretions or excretions, such as sweat, saliva, sputum, nasal mucus, vomit, urine, or feces.  Ensure the mask fits over your nose and mouth tightly, and do not touch it during use. Throw out disposable facemasks and gloves after using them. Do not reuse. Wash your hands immediately after removing your facemask and gloves. If your personal clothing becomes contaminated, carefully remove clothing and launder. Wash your hands after handling contaminated clothing. Place all used disposable facemasks, gloves, and other waste in a lined container before disposing them with other household waste. Remove gloves and wash your hands immediately after handling these items.  Do not share dishes, glasses, or other household items with the patient Avoid sharing household items. You should not share dishes, drinking glasses, cups, eating utensils, towels, bedding, or other items with a patient who is confirmed to have, or being evaluated for, COVID-19 infection. After the person uses these items, you should wash them thoroughly with soap and water.  Wash laundry thoroughly Immediately remove and  wash clothes or bedding that have blood, body fluids, and/or secretions or excretions, such as sweat, saliva, sputum, nasal mucus, vomit, urine, or feces, on them. Wear gloves when handling laundry from the patient. Read and follow directions on labels of laundry or clothing items and detergent. In general, wash and dry with the warmest temperatures recommended on the label.  Clean all areas the individual has used often Clean all touchable surfaces, such as counters, tabletops, doorknobs, bathroom fixtures,  toilets, phones, keyboards, tablets, and bedside tables, every day. Also, clean any surfaces that may have blood, body fluids, and/or secretions or excretions on them. Wear gloves when cleaning surfaces the patient has come in contact with. Use a diluted bleach solution (e.g., dilute bleach with 1 part bleach and 10 parts water) or a household disinfectant with a label that says EPA-registered for coronaviruses. To make a bleach solution at home, add 1 tablespoon of bleach to 1 quart (4 cups) of water. For a larger supply, add  cup of bleach to 1 gallon (16 cups) of water. Read labels of cleaning products and follow recommendations provided on product labels. Labels contain instructions for safe and effective use of the cleaning product including precautions you should take when applying the product, such as wearing gloves or eye protection and making sure you have good ventilation during use of the product. Remove gloves and wash hands immediately after cleaning.  Monitor yourself for signs and symptoms of illness Caregivers and household members are considered close contacts, should monitor their health, and will be asked to limit movement outside of the home to the extent possible. Follow the monitoring steps for close contacts listed on the symptom monitoring form.   ? If you have additional questions, contact your local health department or call the epidemiologist on call at [phone] (available 24/7). ? This guidance is subject to change. For the most up-to-date guidance from [facility], please refer to their website: [email or web address]

## 2020-11-08 NOTE — ED Provider Notes (Signed)
[facility] EMERGENCY DEPARTMENT Provider Note   CSN: [id number] Arrival date & time: 11/08/20  1828     History Chief Complaint  Patient presents with  . Generalized Body Aches    Louis Kennedy is a 60 y.o. male.  HPI  Patient is a 60 year old male with past medical history notable for kidney stones and COVID-19 infection.  He states he is vaccinated x2  Patient states that he is coming to the ER today because he has had body aches, fevers, chills and fatigue for the past 2 days including today.  He denies any nausea vomiting diarrhea chest pain or shortness of breath.  He states he feels relatively well apart from the body aches.  He states his aches are constant seem to include his upper or lower extremity back and shoulders.  Is also had a mild headache.  Circumferential.  No known sick contacts.  He states this is similar to how he felt last time he had COVID-19.  He has not been vaccinated against influenza.  Denies any urinary symptoms denies any cough hemoptysis lightheadedness or dizziness.  No abdominal pain.     Past Medical History:  Diagnosis Date  . Kidney stone     Patient Active Problem List   Diagnosis Date Noted  . Acute respiratory disease due to COVID-19 virus 06/24/2019    History reviewed. No pertinent surgical history.     No family history on file.  Social History   Tobacco Use  . Smoking status: Never Smoker  . Smokeless tobacco: Never Used  Substance Use Topics  . Alcohol use: Never  . Drug use: Never    Home Medications Prior to Admission medications   Medication Sig Start Date End Date Taking? Authorizing Provider  acetaminophen (TYLENOL) 500 MG tablet Take 1,000 mg by mouth every 6 (six) hours as needed for fever.    [provider]  albuterol (VENTOLIN HFA) 108 (90 Base) MCG/ACT inhaler Inhale 2 puffs into the lungs every 6 (six) hours. 06/27/19   Drema Dallas, MD  ascorbic acid (VITAMIN C) 500 MG tablet Take 1  tablet (500 mg total) by mouth daily. 06/28/19   Drema Dallas, MD  dexamethasone (DECADRON) 6 MG tablet Take 1 tablet (6 mg total) by mouth daily. 06/28/19   Drema Dallas, MD  guaiFENesin-dextromethorphan (ROBITUSSIN DM) 100-10 MG/5ML syrup Take 10 mLs by mouth every 4 (four) hours as needed for cough. 06/27/19   Drema Dallas, MD  ibuprofen (ADVIL) 800 MG tablet Take 800 mg by mouth every 8 (eight) hours as needed.    [provider]  metoprolol tartrate (LOPRESSOR) 25 MG tablet Take 1 tablet (25 mg total) by mouth 2 (two) times daily. 06/27/19   Drema Dallas, MD  zinc sulfate 220 (50 Zn) MG capsule Take 1 capsule (220 mg total) by mouth daily. 06/28/19   Drema Dallas, MD    Allergies    Penicillins  Review of Systems   Review of Systems  Constitutional: Positive for chills, fatigue and fever.  HENT: Positive for postnasal drip and rhinorrhea. Negative for congestion and sore throat.   Eyes: Negative for redness.  Respiratory: Negative for cough and shortness of breath.   Cardiovascular: Negative for chest pain and leg swelling.  Gastrointestinal: Negative for abdominal pain, diarrhea, nausea and vomiting.  Endocrine: Negative for polyphagia.  Genitourinary: Negative for dysuria.  Musculoskeletal: Positive for myalgias.  Skin: Negative for rash.  Neurological: Positive for headaches.  Negative for syncope.  Psychiatric/Behavioral: Negative for confusion.    Physical Exam Updated Vital Signs BP (!) 168/95 (BP Location: Left Arm)   Pulse 100   Temp 100 F (37.8 C) (Oral)   Resp 18   Ht 5\' 7"  (1.702 m)   Wt 70.8 kg   SpO2 97%   BMI 24.43 kg/m   Physical Exam Vitals and nursing note reviewed.  Constitutional:      General: He is not in acute distress.    Comments: Pleasant well-appearing 60 year old.  In no acute distress.  Sitting comfortably in bed.  Able answer questions appropriately follow commands. No increased work of breathing. Speaking in full sentences.   HENT:     Head: Normocephalic and atraumatic.     Nose: Nose normal.     Mouth/Throat:     Mouth: Mucous membranes are moist.  Eyes:     General: No scleral icterus. Cardiovascular:     Rate and Rhythm: Normal rate and regular rhythm.     Pulses: Normal pulses.     Heart sounds: Normal heart sounds.  Pulmonary:     Effort: Pulmonary effort is normal. No respiratory distress.     Breath sounds: Normal breath sounds. No wheezing.  Abdominal:     Palpations: Abdomen is soft.     Tenderness: There is no abdominal tenderness. There is no guarding or rebound.  Musculoskeletal:     Cervical back: Normal range of motion.     Right lower leg: No edema.     Left lower leg: No edema.  Skin:    General: Skin is warm and dry.     Capillary Refill: Capillary refill takes less than 2 seconds.  Neurological:     Mental Status: He is alert. Mental status is at baseline.  Psychiatric:        Mood and Affect: Mood normal.        Behavior: Behavior normal.     ED Results / Procedures / Treatments   Labs (all labs ordered are listed, but only abnormal results are displayed) Labs Reviewed  RESP PANEL BY RT-PCR (FLU A&B, COVID) [id number]    EKG None  Radiology No results found.  Procedures Procedures   Medications Ordered in ED Medications  acetaminophen (TYLENOL) tablet 1,000 mg (1,000 mg Oral Given 11/08/20 1959)    ED Course  I have reviewed the triage vital signs and the nursing notes.  Pertinent labs & imaging results that were available during my care of the patient were reviewed by me and considered in my medical decision making (see chart for details).    MDM Rules/Calculators/A&P                          Patient is well-appearing 60 year old male here with some body aches and fevers as started yesterday.  Is requesting COVID testing.  Will test for COVID and influenza.  Patient given conservative therapy instructions.  He understands that is symptoms are most consistent  with a viral illness.  Lung exam is normal.  Doubt pneumonia.  Patient is also not coughing.  Louis Kennedy was evaluated in Emergency Department on 11/09/2020 for the symptoms described in the history of present illness. He was evaluated in the context of the global COVID-19 pandemic, which necessitated consideration that the patient might be at risk for infection with the SARS-CoV-2 virus that causes COVID-19. Institutional protocols and algorithms that pertain to the evaluation of patients at  risk for COVID-19 are in a state of rapid change based on information released by regulatory bodies including the CDC and federal and state organizations. These policies and algorithms were followed during the patient's care in the ED.   Patient is vaccinated against COVID.  He has had COVID once before.  Patient relatively low suspicion for this given his history.  Favor different viral syndrome.  Understands importance of Tylenol and ibuprofen to control fever and symptoms.  We will follow-up with PCP.  Return precautions given.  Final Clinical Impression(s) / ED Diagnoses Final diagnoses:  Viral syndrome  Suspected COVID-19 virus infection    Rx / DC Orders ED Discharge Orders    None       Gailen Shelter, [place] 11/09/20 0006    Melene Plan, DO 11/13/20 2257

## 2020-11-09 DIAGNOSIS — M2550 Pain in unspecified joint: Secondary | ICD-10-CM | POA: Diagnosis not present

## 2020-11-09 DIAGNOSIS — M791 Myalgia, unspecified site: Secondary | ICD-10-CM | POA: Diagnosis not present

## 2021-01-05 IMAGING — CT CT ANGIO CHEST
2 of 5 series · 19 of 36 positions shown · IV contrast (omnipaque)
Comparison: None.

CLINICAL DATA: Shortness of breath, elevated D-dimer level.

EXAM:
CT ANGIOGRAPHY CHEST WITH CONTRAST
TECHNIQUE: Multidetector CT imaging of the chest was performed using the
standard protocol during bolus administration of intravenous
contrast. Multiplanar CT image reconstructions and MIPs were
obtained to evaluate the vascular anatomy.
CONTRAST:  100mL OMNIPAQUE IOHEXOL 350 MG/ML SOLN

[Series 5: thins · axial · 0.64mm/px · z∈[-303,-28]mm · 18 of 299 slices shown]
[im 12/299  lung]
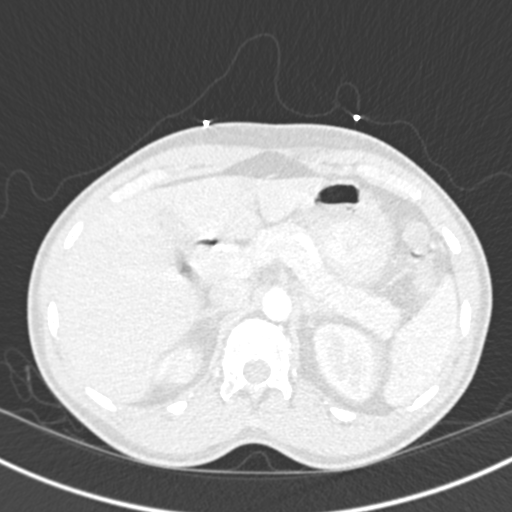
[im 34/299  mediastinal]
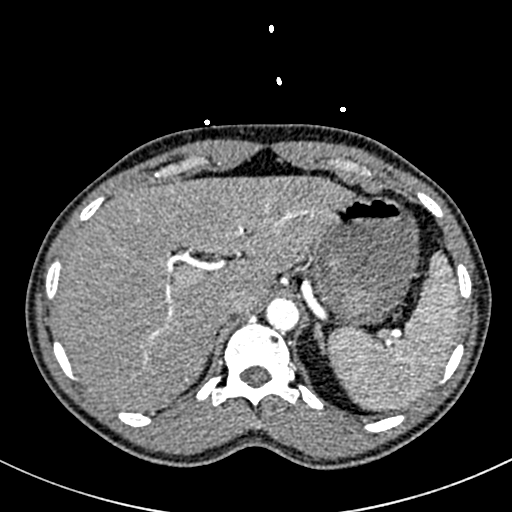
[im 45/299  lung]
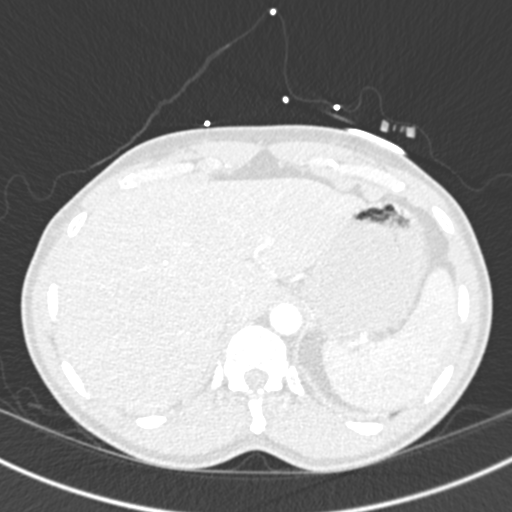
[im 67/299  mediastinal]
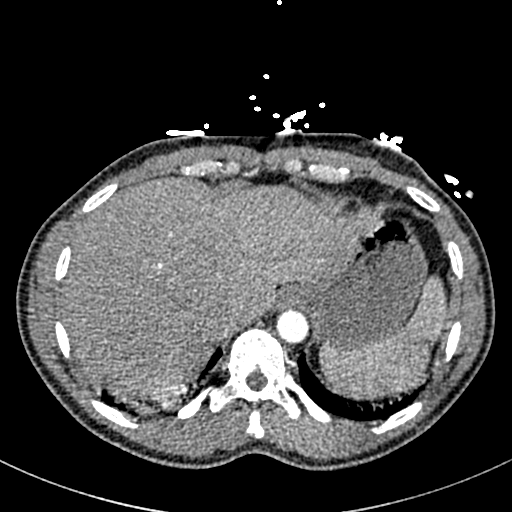
[im 78/299  lung]
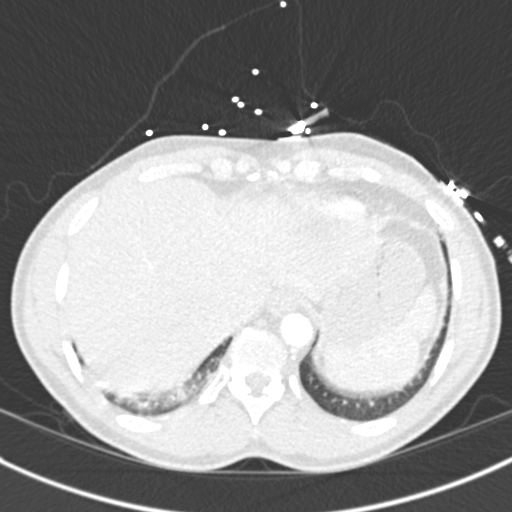
[im 89/299  mediastinal]
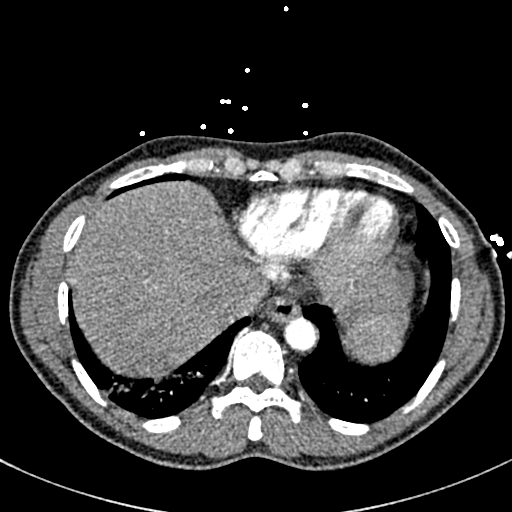
[im 111/299  lung]
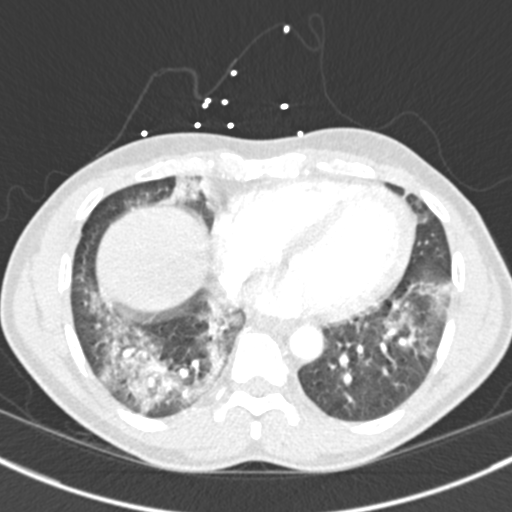
[im 122/299  mediastinal]
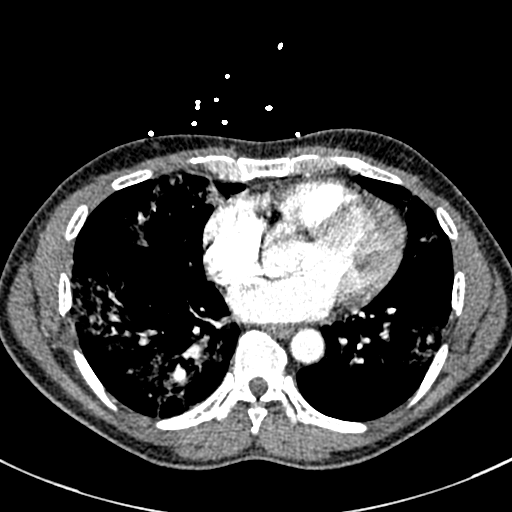
[im 144/299  lung]
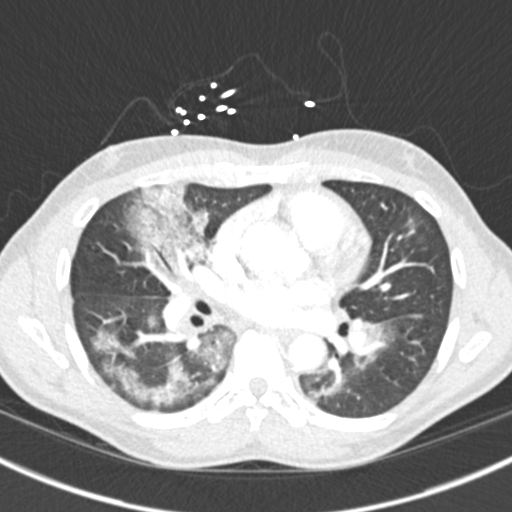
[im 155/299  mediastinal]
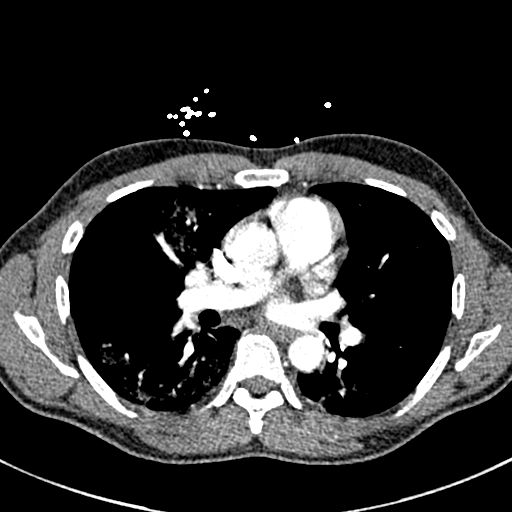
[im 177/299  lung]
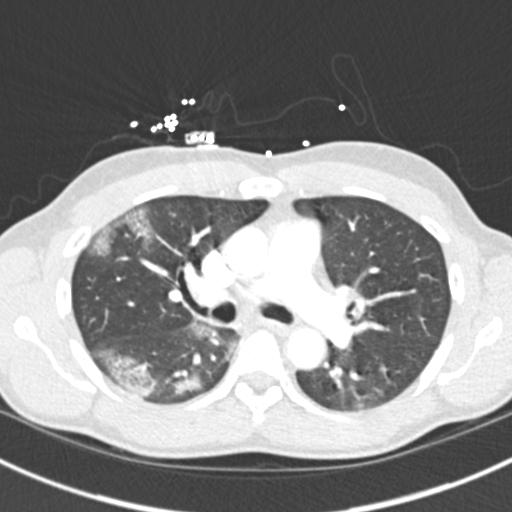
[im 188/299  mediastinal]
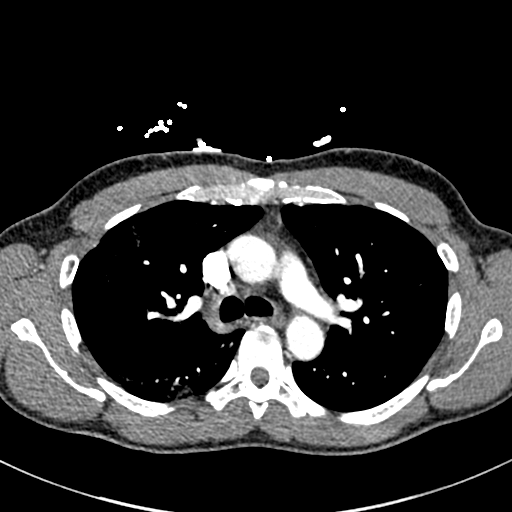
[im 210/299  lung]
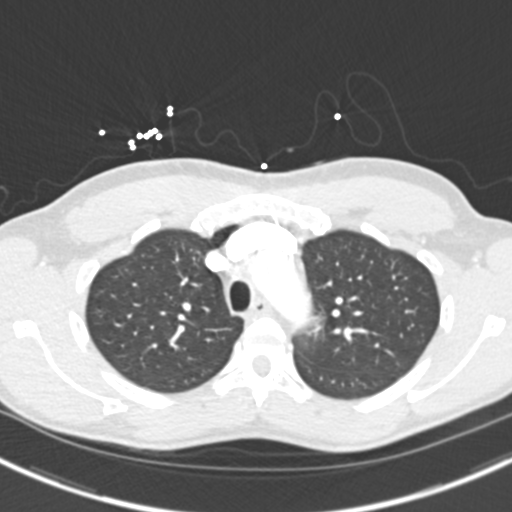
[im 221/299  mediastinal]
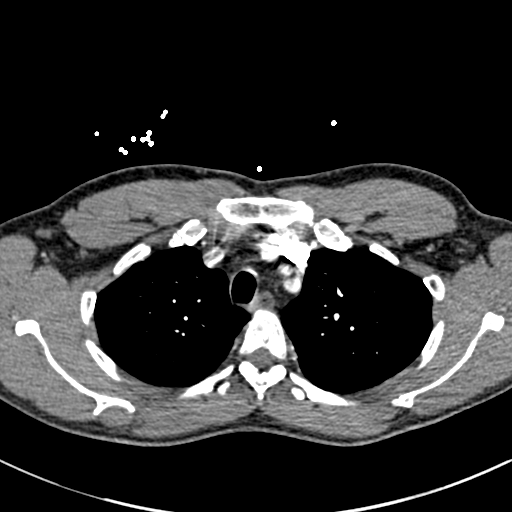
[im 232/299  lung]
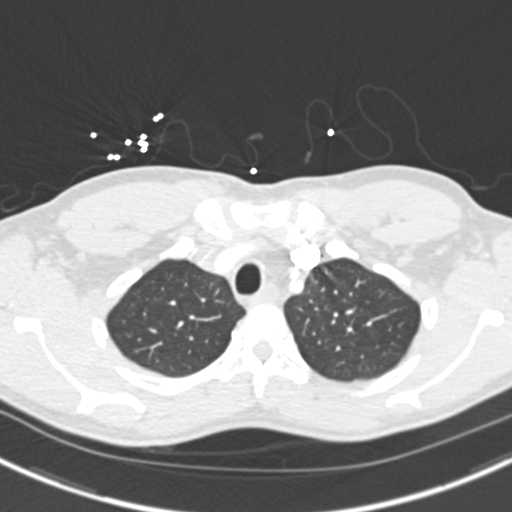
[im 254/299  mediastinal]
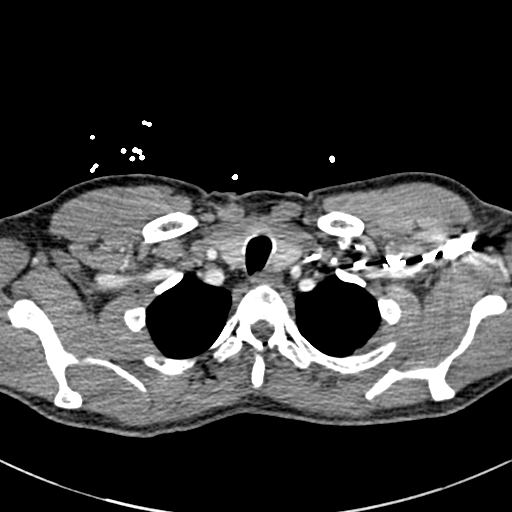
[im 265/299  lung]
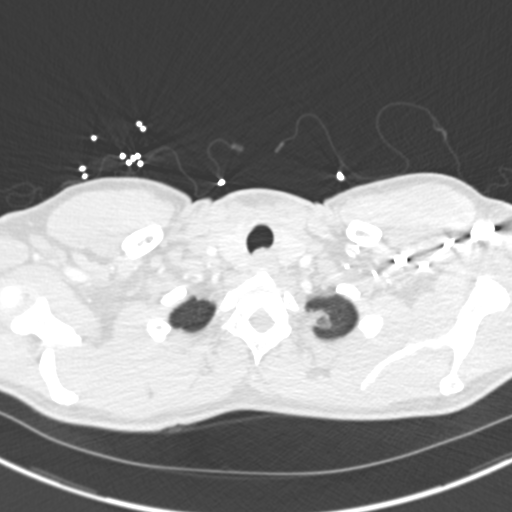
[im 287/299  mediastinal]
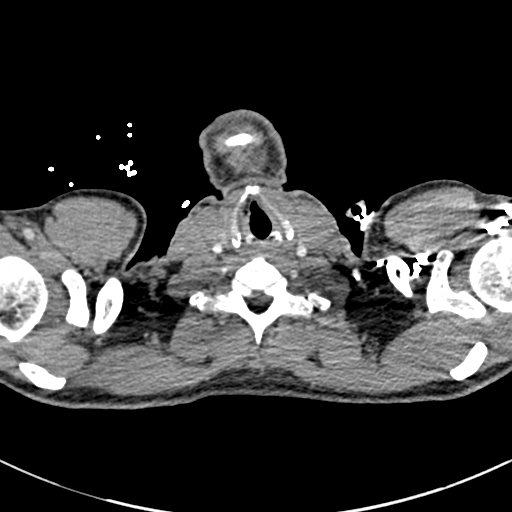

[Series 6: coronal mpr · coronal · 0.63mm/px · 1 of 110 slices shown]
[im 55/110  mediastinal]
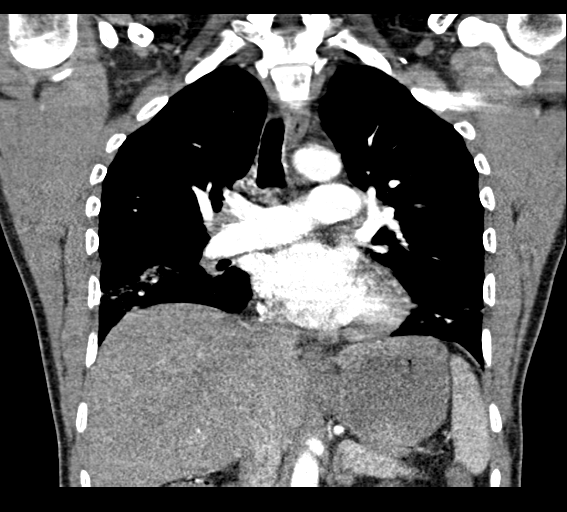

[19 of 36 positions shown; findings below may reference images not displayed]

FINDINGS: Cardiovascular: Satisfactory opacification of the pulmonary arteries
to the segmental level. No evidence of pulmonary embolism. Normal
heart size. No pericardial effusion.

Mediastinum/Nodes: No enlarged mediastinal, hilar, or axillary lymph
nodes. Thyroid gland, trachea, and esophagus demonstrate no
significant findings.

Lungs/Pleura: No pneumothorax or pleural effusion is noted. Multiple
ground-glass opacities are noted throughout both lungs consistent
with multifocal pneumonia, potentially of viral etiology.

Upper Abdomen: No acute abnormality.

Musculoskeletal: No chest wall abnormality. No acute or significant
osseous findings.

Review of the MIP images confirms the above findings.
IMPRESSION: 1. No definite evidence of pulmonary embolus.
2. Multiple ground-glass opacities are noted throughout both lungs
consistent with multifocal pneumonia, potentially of viral etiology.

## 2021-01-05 IMAGING — DX DG CHEST 1V PORT
1 series · 1 of 1 positions shown · non-contrast
Comparison: None.

CLINICAL DATA: 7RFY8-C6 positive.

EXAM:
PORTABLE CHEST 1 VIEW

[chest ap]
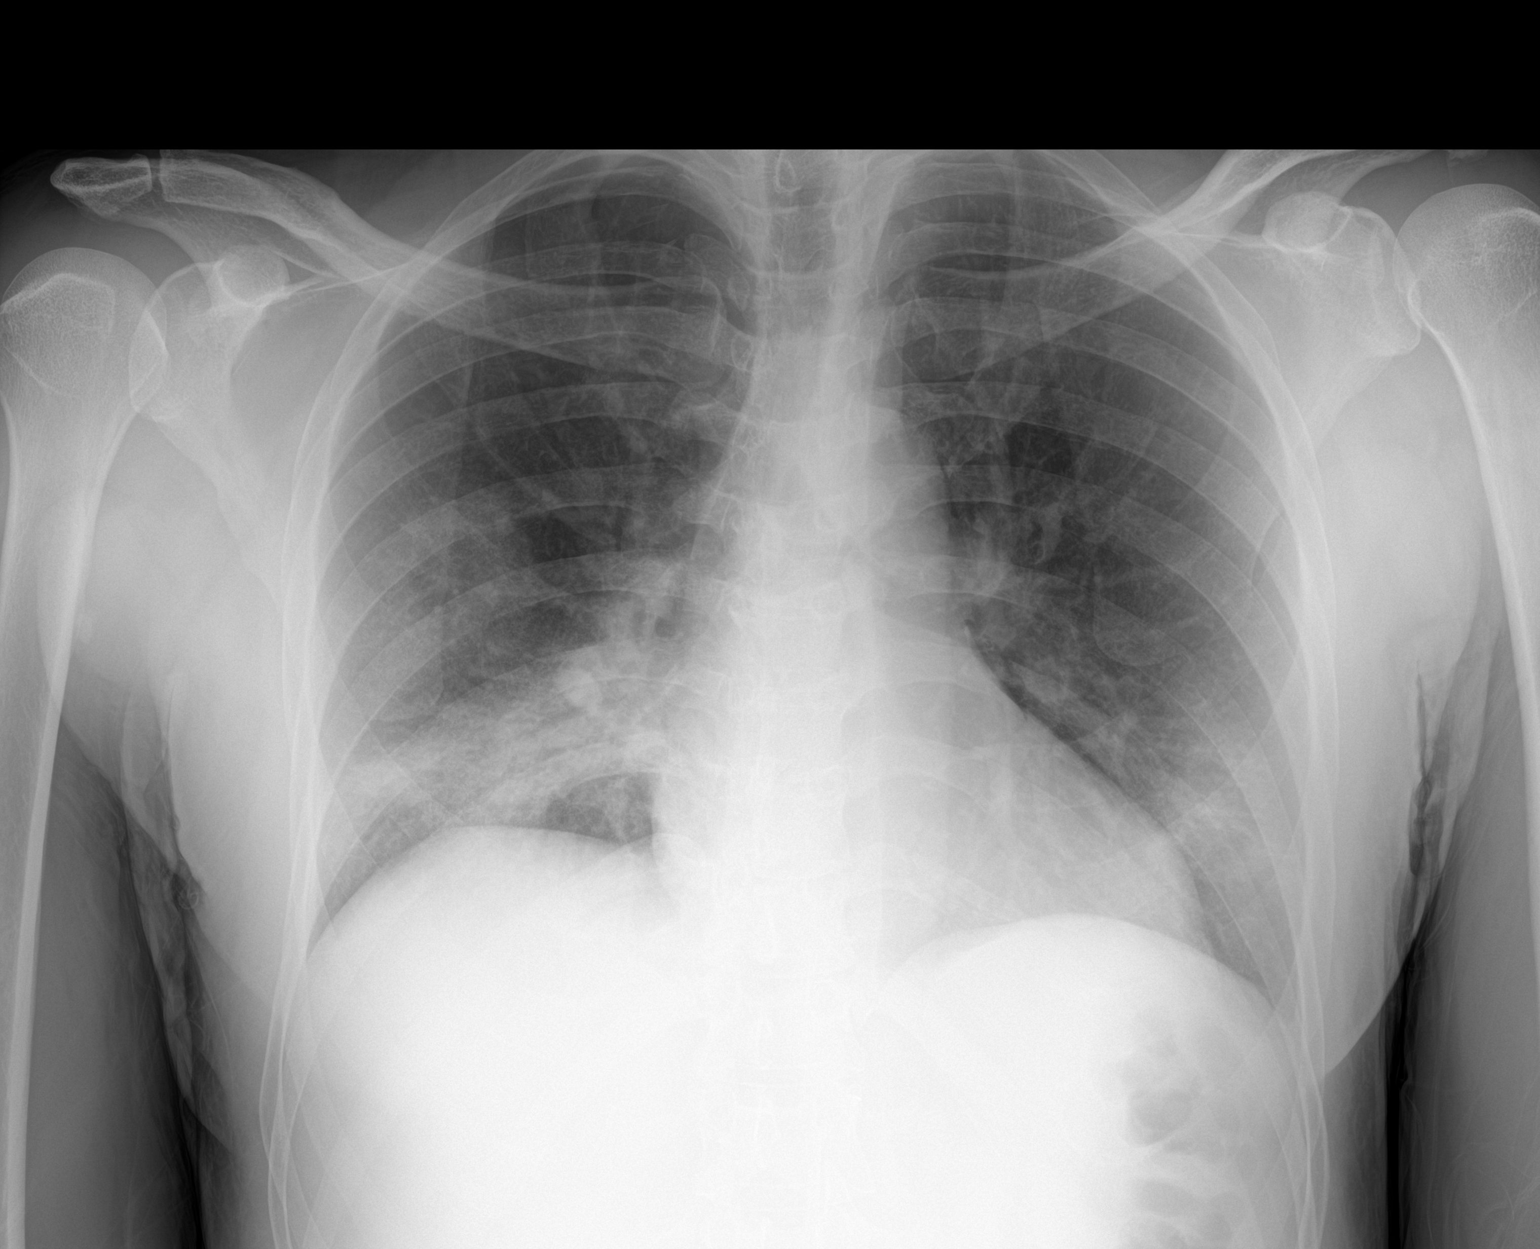

[1 of 1 positions shown; findings below may reference images not displayed]

FINDINGS: Confluent airspace disease at the right lung base, with additional
vague patchy opacities in the right mid lung and left lower lung
zone. The heart is normal in size. No pulmonary edema, pleural
effusion, or pneumothorax. No acute osseous abnormalities are seen.
IMPRESSION: Vague patchy opacities in both lungs, pattern consistent with
7RFY8-C6 pneumonia. More confluent right lower lobe opacity may
represent COVID or superimposed bacterial infection.

## 2021-01-07 IMAGING — DX DG CHEST 1V PORT
1 series · 1 of 1 positions shown · non-contrast
Comparison: Radiographs and CTA 06/22/2019

CLINICAL DATA: Hypoxia. Shortness of breath.  [id number] positive.

EXAM:
PORTABLE CHEST 1 VIEW

[chest ap]
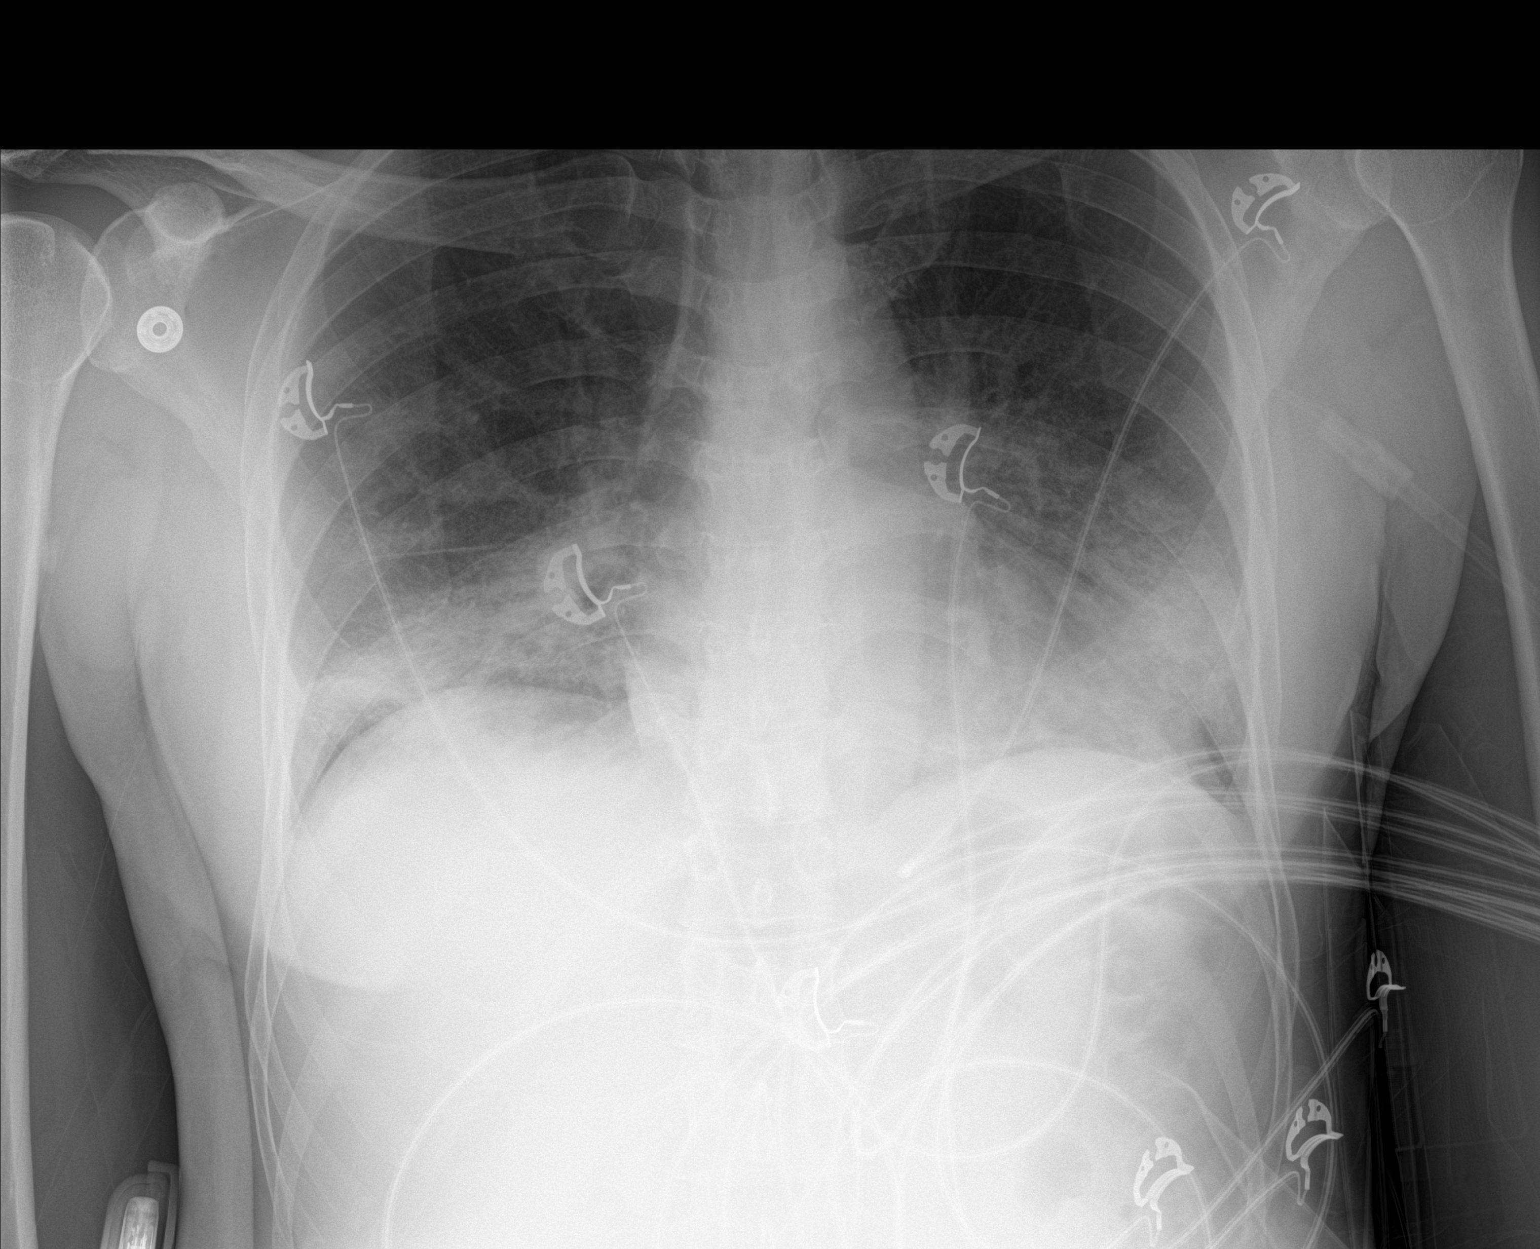

[1 of 1 positions shown; findings below may reference images not displayed]

FINDINGS: Persistent low lung volumes. Worsening bilateral airspace disease
primarily at the left lung base. The heart is normal in size. Normal
mediastinal contours. No pneumothorax or large pleural effusion.
IMPRESSION: Worsening bilateral airspace disease consistent with COVID
pneumonia.

## 2021-04-01 DIAGNOSIS — I10 Essential (primary) hypertension: Secondary | ICD-10-CM | POA: Diagnosis not present

## 2021-04-01 DIAGNOSIS — Z0000 Encounter for general adult medical examination without abnormal findings: Secondary | ICD-10-CM | POA: Diagnosis not present

## 2021-04-01 DIAGNOSIS — Z23 Encounter for immunization: Secondary | ICD-10-CM | POA: Diagnosis not present

## 2021-04-01 DIAGNOSIS — E7800 Pure hypercholesterolemia, unspecified: Secondary | ICD-10-CM | POA: Diagnosis not present

## 2021-08-26 DIAGNOSIS — R3912 Poor urinary stream: Secondary | ICD-10-CM | POA: Diagnosis not present

## 2021-08-26 DIAGNOSIS — N401 Enlarged prostate with lower urinary tract symptoms: Secondary | ICD-10-CM | POA: Diagnosis not present

## 2021-09-02 DIAGNOSIS — N529 Male erectile dysfunction, unspecified: Secondary | ICD-10-CM | POA: Diagnosis not present

## 2021-09-02 DIAGNOSIS — N401 Enlarged prostate with lower urinary tract symptoms: Secondary | ICD-10-CM | POA: Diagnosis not present

## 2021-09-02 DIAGNOSIS — R972 Elevated prostate specific antigen [PSA]: Secondary | ICD-10-CM | POA: Diagnosis not present

## 2021-09-02 DIAGNOSIS — R3912 Poor urinary stream: Secondary | ICD-10-CM | POA: Diagnosis not present

## 2021-11-12 ENCOUNTER — Other Ambulatory Visit: Payer: Self-pay | Admitting: Urology

## 2021-11-12 DIAGNOSIS — R972 Elevated prostate specific antigen [PSA]: Secondary | ICD-10-CM

## 2021-11-29 DIAGNOSIS — R350 Frequency of micturition: Secondary | ICD-10-CM | POA: Diagnosis not present

## 2021-11-30 ENCOUNTER — Ambulatory Visit
Admission: RE | Admit: 2021-11-30 | Discharge: 2021-11-30 | Disposition: A | Discharge status: Home / Self Care | Payer: BC Managed Care – PPO | Source: Ambulatory Visit | Attending: Urology | Admitting: Urology

## 2021-11-30 DIAGNOSIS — R972 Elevated prostate specific antigen [PSA]: Secondary | ICD-10-CM | POA: Diagnosis not present

## 2021-11-30 MED ORDER — GADOBENATE DIMEGLUMINE 529 MG/ML IV SOLN
14.0000 mL | Freq: Once | INTRAVENOUS | Status: AC | PRN
  Administered 2021-11-30: 14 mL via INTRAVENOUS

## 2021-12-06 DIAGNOSIS — R350 Frequency of micturition: Secondary | ICD-10-CM | POA: Diagnosis not present

## 2021-12-06 DIAGNOSIS — R972 Elevated prostate specific antigen [PSA]: Secondary | ICD-10-CM | POA: Diagnosis not present

## 2021-12-06 DIAGNOSIS — R3912 Poor urinary stream: Secondary | ICD-10-CM | POA: Diagnosis not present

## 2021-12-06 DIAGNOSIS — N401 Enlarged prostate with lower urinary tract symptoms: Secondary | ICD-10-CM | POA: Diagnosis not present

## 2022-01-25 DIAGNOSIS — R350 Frequency of micturition: Secondary | ICD-10-CM | POA: Diagnosis not present

## 2022-01-25 DIAGNOSIS — R972 Elevated prostate specific antigen [PSA]: Secondary | ICD-10-CM | POA: Diagnosis not present

## 2022-01-25 DIAGNOSIS — R3912 Poor urinary stream: Secondary | ICD-10-CM | POA: Diagnosis not present

## 2022-04-04 DIAGNOSIS — Z0000 Encounter for general adult medical examination without abnormal findings: Secondary | ICD-10-CM | POA: Diagnosis not present

## 2022-04-04 DIAGNOSIS — Z23 Encounter for immunization: Secondary | ICD-10-CM | POA: Diagnosis not present

## 2022-04-04 DIAGNOSIS — I10 Essential (primary) hypertension: Secondary | ICD-10-CM | POA: Diagnosis not present

## 2022-04-04 DIAGNOSIS — E7800 Pure hypercholesterolemia, unspecified: Secondary | ICD-10-CM | POA: Diagnosis not present

## 2022-07-01 DIAGNOSIS — E7800 Pure hypercholesterolemia, unspecified: Secondary | ICD-10-CM | POA: Diagnosis not present

## 2022-07-01 DIAGNOSIS — Z79899 Other long term (current) drug therapy: Secondary | ICD-10-CM | POA: Diagnosis not present

## 2022-07-01 DIAGNOSIS — Z23 Encounter for immunization: Secondary | ICD-10-CM | POA: Diagnosis not present

## 2023-04-10 DIAGNOSIS — Z1159 Encounter for screening for other viral diseases: Secondary | ICD-10-CM | POA: Diagnosis not present

## 2023-04-10 DIAGNOSIS — N400 Enlarged prostate without lower urinary tract symptoms: Secondary | ICD-10-CM | POA: Diagnosis not present

## 2023-04-10 DIAGNOSIS — Z0000 Encounter for general adult medical examination without abnormal findings: Secondary | ICD-10-CM | POA: Diagnosis not present

## 2023-04-10 DIAGNOSIS — Z23 Encounter for immunization: Secondary | ICD-10-CM | POA: Diagnosis not present

## 2023-04-10 DIAGNOSIS — E7800 Pure hypercholesterolemia, unspecified: Secondary | ICD-10-CM | POA: Diagnosis not present

## 2023-04-10 DIAGNOSIS — I10 Essential (primary) hypertension: Secondary | ICD-10-CM | POA: Diagnosis not present

## 2023-10-18 DIAGNOSIS — R3916 Straining to void: Secondary | ICD-10-CM | POA: Diagnosis not present

## 2023-10-18 DIAGNOSIS — N401 Enlarged prostate with lower urinary tract symptoms: Secondary | ICD-10-CM | POA: Diagnosis not present

## 2023-10-18 DIAGNOSIS — R3912 Poor urinary stream: Secondary | ICD-10-CM | POA: Diagnosis not present

## 2023-10-18 DIAGNOSIS — R972 Elevated prostate specific antigen [PSA]: Secondary | ICD-10-CM | POA: Diagnosis not present

## 2023-12-20 DIAGNOSIS — D123 Benign neoplasm of transverse colon: Secondary | ICD-10-CM | POA: Diagnosis not present

## 2023-12-20 DIAGNOSIS — D122 Benign neoplasm of ascending colon: Secondary | ICD-10-CM | POA: Diagnosis not present

## 2023-12-20 DIAGNOSIS — Z1211 Encounter for screening for malignant neoplasm of colon: Secondary | ICD-10-CM | POA: Diagnosis not present

## 2024-01-25 DIAGNOSIS — R079 Chest pain, unspecified: Secondary | ICD-10-CM | POA: Diagnosis not present

## 2024-01-25 DIAGNOSIS — R0602 Shortness of breath: Secondary | ICD-10-CM | POA: Diagnosis not present

## 2024-01-25 DIAGNOSIS — E7800 Pure hypercholesterolemia, unspecified: Secondary | ICD-10-CM | POA: Diagnosis not present

## 2024-01-25 DIAGNOSIS — I10 Essential (primary) hypertension: Secondary | ICD-10-CM | POA: Diagnosis not present

## 2024-01-26 ENCOUNTER — Other Ambulatory Visit: Payer: Self-pay | Admitting: Physician Assistant

## 2024-01-26 ENCOUNTER — Ambulatory Visit
Admission: RE | Admit: 2024-01-26 | Discharge: 2024-01-26 | Disposition: A | Discharge status: Home / Self Care | Source: Ambulatory Visit | Attending: Physician Assistant | Admitting: Physician Assistant

## 2024-01-26 DIAGNOSIS — R0602 Shortness of breath: Secondary | ICD-10-CM | POA: Diagnosis not present

## 2024-02-09 DIAGNOSIS — R972 Elevated prostate specific antigen [PSA]: Secondary | ICD-10-CM | POA: Diagnosis not present

## 2024-03-04 ENCOUNTER — Ambulatory Visit: Attending: Physician Assistant | Admitting: Physician Assistant

## 2024-03-04 VITALS — BP 157/84 | HR 75 | Ht 67.0 in | Wt 168.0 lb

## 2024-03-04 DIAGNOSIS — J069 Acute upper respiratory infection, unspecified: Secondary | ICD-10-CM | POA: Diagnosis not present

## 2024-03-04 DIAGNOSIS — R079 Chest pain, unspecified: Secondary | ICD-10-CM

## 2024-03-04 DIAGNOSIS — R072 Precordial pain: Secondary | ICD-10-CM

## 2024-03-04 DIAGNOSIS — U071 COVID-19: Secondary | ICD-10-CM | POA: Diagnosis not present

## 2024-03-04 MED ORDER — METOPROLOL TARTRATE 50 MG PO TABS: 50.0000 mg | ORAL_TABLET | Freq: Two times a day (BID) | ORAL | 3 refills | Status: AC

## 2024-03-04 NOTE — Patient Instructions (Addendum)
 Medication Instructions:  Increase Metoprolol  50 mg twice daily On th day of the cardiac CT, please take 2 tablets of Metoprolol   *If you need a refill on your cardiac medications before your next appointment, please call your pharmacy*  Lab Work: BMET today   Testing/Procedures:   Your cardiac CT will be scheduled at one of the below locations:   [facility] [address]  [place], [place] [address] [phone]   If scheduled at the Heart and Vascular Tower at [facility] street, please enter the parking lot using the [address] entrance and use the FREE valet service at the patient drop-off area. Enter the building and check-in with registration on the main floor.  If scheduled at [facility], please arrive to the Heart and Vascular Center 15 mins early for check-in and test prep.  There is spacious parking and easy access to the radiology department from the [facility] entrance. Please enter here and check-in with the desk attendant.   If scheduled at [facility], please arrive 30 minutes early for check-in and test prep.  Please follow these instructions carefully (unless otherwise directed):  An IV will be required for this test and Nitroglycerin will be given.  Hold all erectile dysfunction medications at least 3 days (72 hrs) prior to test. (Ie viagra, cialis, sildenafil, tadalafil, etc)   On the Night Before the Test: Be sure to Drink plenty of water. Do not consume any caffeinated/decaffeinated beverages or chocolate 12 hours prior to your test. Do not take any antihistamines 12 hours prior to your test.  If the patient has contrast allergy: Patient will need a prescription for Prednisone and very clear instructions (as follows): Prednisone 50 mg - take 13 hours prior to test Take another Prednisone 50 mg 7 hours prior to test Take another Prednisone 50 mg 1 hour prior to test Take  Benadryl  50 mg 1 hour prior to test Patient must complete all four doses of above prophylactic medications. Patient will need a ride after test due to Benadryl .  On the Day of the Test: Drink plenty of water until 1 hour prior to the test. Do not eat any food 1 hour prior to test. You may take your regular medications prior to the test.  Take metoprolol  (Lopressor ) two hours prior to test. If you take Furosemide /Hydrochlorothiazide/Spironolactone/Chlorthalidone, please HOLD on the morning of the test. Patients who wear a continuous glucose monitor MUST remove the device prior to scanning.  After the Test: Drink plenty of water. After receiving IV contrast, you may experience a mild flushed feeling. This is normal. On occasion, you may experience a mild rash up to 24 hours after the test. This is not dangerous. If this occurs, you can take Benadryl  25 mg, Zyrtec, Claritin, or Allegra and increase your fluid intake. (Patients taking Tikosyn should avoid Benadryl , and may take Zyrtec, Claritin, or Allegra) If you experience trouble breathing, this can be serious. If it is severe call 911 IMMEDIATELY. If it is mild, please call our office.  We will call to schedule your test 2-4 weeks out understanding that some insurance companies will need an authorization prior to the service being performed.   For more information and frequently asked questions, please visit our website : [email or web address]  For non-scheduling related questions, please contact the cardiac imaging nurse navigator should you have any questions/concerns: Cardiac Imaging Nurse Navigators Direct Office Dial: [phone]   For scheduling needs, including  cancellations and rescheduling, please call [place], [phone].   Follow-Up: At [facility], you and your health needs are our priority.  As part of our continuing mission to provide you with exceptional heart care, our providers are all part of  one team.  This team includes your primary Cardiologist (physician) and Advanced Practice Providers or APPs (Physician Assistants and Nurse Practitioners) who all work together to provide you with the care you need, when you need it.  Your next appointment:    As needed, Hao Meng PA    We recommend signing up for the patient portal called MyChart.  Sign up information is provided on this After Visit Summary.  MyChart is used to connect with patients for Virtual Visits (Telemedicine).  Patients are able to view lab/test results, encounter notes, upcoming appointments, etc.  Non-urgent messages can be sent to your provider as well.   To learn more about what you can do with MyChart, go to [email or web address].

## 2024-03-04 NOTE — Progress Notes (Unsigned)
  Cardiology Office Note   Date:  03/06/2024  ID:  Louis Kennedy, DOB 1961/06/11, MRN [id number] PCP: Alvera Reagin, PA  [facility] Providers Cardiologist:  [facility] - DOD Dr. Jeffrie  History of Present Illness Louis Kennedy is a 63 y.o. male with past medical history of hypertension, hyperlipidemia, BPH with elevated PSA and hypercalcemia.  PSA was 6.1 in October 2024.  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  Lipid panel in October 2024 showed a total cholesterol 208, HDL 48, triglyceride 99, LDL 143 he was recently seen by PCP at [facility] physicians on 01/25/2024 at which time he described 2 weeks onset of chest pain and worsening dyspnea with exertion for 29-month.  He also had bilateral jaw pain.    Patient presents today for evaluation of chest discomfort, his chest pain typically occurs at rest and does not occur when he exercises.  He says he has not been very active fall while and is recently getting back to exercise.  He only experiences shortness of breath with more strenuous exercise but not with everyday activity.  I suspect this is related to deconditioning.  As for his chest discomfort, I recommended coronary CTA to further assess.  His blood pressure is elevated, I will increase his metoprolol  to 50 mg twice a day however in the morning of coronary CTA, he has been instructed to take 100 mg dosage.  Will obtain basic metabolic panel.   ROS:   He complains of intermittent chest discomfort.  He also has shortness of breath with more strenuous exercise but not with everyday activity.  Studies Reviewed EKG Interpretation Date/Time:  Monday March 04 2024 14:42:21 EDT Ventricular Rate:  75 PR Interval:  152 QRS Duration:  84 QT Interval:  370 QTC Calculation: 413 R  Axis:   -34  Text Interpretation: Normal sinus rhythm Left axis deviation When compared with ECG of 24-Jun-2019 05:33, PREVIOUS ECG IS PRESENT Confirmed by Janene Boer [phone]) on 03/04/2024 3:16:56 PM        Risk Assessment/Calculations          Physical Exam VS:  BP (!) 157/84   Pulse 75   Ht 5' 7 (1.702 m)   Wt 168 lb (76.2 kg)   SpO2 96%   BMI 26.31 kg/m        Wt Readings from Last 3 Encounters:  03/04/24 168 lb (76.2 kg)  11/08/20 156 lb (70.8 kg)  06/24/19 154 lb 12.2 oz (70.2 kg)    GEN: Well nourished, well developed in no acute distress NECK: No JVD; No carotid bruits CARDIAC: RRR, no murmurs, rubs, gallops RESPIRATORY:  Clear to auscultation without rales, wheezing or rhonchi  ABDOMEN: Soft, non-tender, non-distended EXTREMITIES:  No edema; No deformity   ASSESSMENT AND PLAN  Chest discomfort: Will proceed with a coronary CTA to further assess.  In the morning of coronary CTA, he will need 100 mg of metoprolol  tartrate.  Hypertension: Blood pressure continue to be elevated today, will increase metoprolol  to 50 mg twice a day.       Dispo: If coronary CTA is reassuring, then no further workup.  Signed, Boer Janene, PA

## 2024-03-05 LAB — BASIC METABOLIC PANEL WITH GFR
BUN/Creatinine Ratio: 11 (ref 10–24)
BUN: 14 mg/dL (ref 8–27)
CO2: 23 mmol/L (ref 20–29)
Calcium: 10.7 mg/dL — ABNORMAL HIGH (ref 8.6–10.2)
Chloride: 103 mmol/L (ref 96–106)
Creatinine, Ser: 1.23 mg/dL (ref 0.76–1.27)
Glucose: 84 mg/dL (ref 70–99)
Potassium: 4.4 mmol/L (ref 3.5–5.2)
Sodium: 140 mmol/L (ref 134–144)
eGFR: 67 mL/min/1.73 (ref >59)

## 2024-03-06 ENCOUNTER — Ambulatory Visit: Payer: Self-pay | Admitting: Physician Assistant

## 2024-03-06 NOTE — Progress Notes (Signed)
Stable renal function and electrolyte

## 2024-03-07 NOTE — Addendum Note (Signed)
 Addended by: KENETH KNEE C on: 03/07/2024 08:20 AM   Modules accepted: Orders

## 2024-03-14 NOTE — Progress Notes (Signed)
 Attempted to call patient no answer, left a vm to call back.

## 2024-03-19 NOTE — Telephone Encounter (Signed)
 Spoke with pt. Pt was notified of lab results. Coronary CTA was ordered. Please arrange apt.

## 2024-03-19 NOTE — Telephone Encounter (Signed)
 Pt returning call to a nurse for results

## 2024-03-27 ENCOUNTER — Encounter (HOSPITAL_COMMUNITY): Payer: Self-pay

## 2024-04-09 ENCOUNTER — Encounter (HOSPITAL_COMMUNITY): Payer: Self-pay

## 2024-04-11 ENCOUNTER — Ambulatory Visit (HOSPITAL_COMMUNITY)
Admission: RE | Admit: 2024-04-11 | Discharge: 2024-04-11 | Disposition: A | Discharge status: Home / Self Care | Source: Ambulatory Visit | Attending: Physician Assistant | Admitting: Physician Assistant

## 2024-04-11 DIAGNOSIS — R072 Precordial pain: Secondary | ICD-10-CM | POA: Insufficient documentation

## 2024-04-11 DIAGNOSIS — J984 Other disorders of lung: Secondary | ICD-10-CM | POA: Diagnosis not present

## 2024-04-11 DIAGNOSIS — R079 Chest pain, unspecified: Secondary | ICD-10-CM | POA: Insufficient documentation

## 2024-04-11 MED ORDER — IOHEXOL 350 MG/ML SOLN
100.0000 mL | Freq: Once | INTRAVENOUS | Status: AC | PRN
  Administered 2024-04-11: 100 mL via INTRAVENOUS

## 2024-04-11 MED ORDER — NITROGLYCERIN 0.4 MG SL SUBL
0.8000 mg | SUBLINGUAL_TABLET | Freq: Once | SUBLINGUAL | Status: AC
  Administered 2024-04-11: 0.8 mg via SUBLINGUAL

## 2024-04-12 NOTE — Progress Notes (Signed)
 Cardiac portion shows normal coronary artery, no coronary artery disease at all.

## 2024-04-18 NOTE — Telephone Encounter (Signed)
 Spoke with pt regarding CTA results. Pt verbalized understanding. Pt had no further questions.

## 2024-05-28 DIAGNOSIS — E7800 Pure hypercholesterolemia, unspecified: Secondary | ICD-10-CM | POA: Diagnosis not present

## 2024-05-28 DIAGNOSIS — Z0000 Encounter for general adult medical examination without abnormal findings: Secondary | ICD-10-CM | POA: Diagnosis not present

## 2024-05-28 DIAGNOSIS — I10 Essential (primary) hypertension: Secondary | ICD-10-CM | POA: Diagnosis not present

## 2024-05-28 DIAGNOSIS — N400 Enlarged prostate without lower urinary tract symptoms: Secondary | ICD-10-CM | POA: Diagnosis not present

## 2024-05-28 DIAGNOSIS — N529 Male erectile dysfunction, unspecified: Secondary | ICD-10-CM | POA: Diagnosis not present

## 2024-05-28 DIAGNOSIS — Z23 Encounter for immunization: Secondary | ICD-10-CM | POA: Diagnosis not present
